# Patient Record
Sex: Male | Born: 1960 | Race: White | Hispanic: No | Marital: Married | Smoking: Never smoker
Health system: Southern US, Community
[De-identification: ages and names within clinical notes are randomized; demographics above are authoritative.]

## PROBLEM LIST (undated history)

## (undated) DIAGNOSIS — C8190 Hodgkin lymphoma, unspecified, unspecified site: Secondary | ICD-10-CM

## (undated) DIAGNOSIS — I509 Heart failure, unspecified: Secondary | ICD-10-CM

## (undated) DIAGNOSIS — E119 Type 2 diabetes mellitus without complications: Secondary | ICD-10-CM

## (undated) DIAGNOSIS — E785 Hyperlipidemia, unspecified: Secondary | ICD-10-CM

## (undated) DIAGNOSIS — E079 Disorder of thyroid, unspecified: Secondary | ICD-10-CM

## (undated) DIAGNOSIS — C801 Malignant (primary) neoplasm, unspecified: Secondary | ICD-10-CM

## (undated) HISTORY — PX: CHOLECYSTECTOMY: SHX55

## (undated) HISTORY — PX: HERNIA REPAIR: SHX51

## (undated) HISTORY — PX: FOOT SURGERY: SHX648

## (undated) HISTORY — PX: BONE MARROW TRANSPLANT: SHX200

## (undated) HISTORY — PX: EXPLORATORY LAPAROTOMY: SUR591

## (undated) HISTORY — PX: SPLENECTOMY: SUR1306

---

## 2010-12-21 ENCOUNTER — Ambulatory Visit: Payer: Self-pay | Admitting: General Practice

## 2012-12-11 ENCOUNTER — Ambulatory Visit: Payer: Self-pay | Admitting: General Practice

## 2013-02-17 LAB — CBC
HGB: 14.6 g/dL (ref 13.0–18.0)
RBC: 4.56 10*6/uL (ref 4.40–5.90)

## 2013-02-17 LAB — BASIC METABOLIC PANEL
BUN: 27 mg/dL — ABNORMAL HIGH (ref 7–18)
Chloride: 102 mmol/L (ref 98–107)
Creatinine: 1.39 mg/dL — ABNORMAL HIGH (ref 0.60–1.30)
Glucose: 127 mg/dL — ABNORMAL HIGH (ref 65–99)
Osmolality: 284 (ref 275–301)
Potassium: 4.3 mmol/L (ref 3.5–5.1)

## 2013-02-17 LAB — TROPONIN I: Troponin-I: 0.02 ng/mL

## 2013-02-18 ENCOUNTER — Inpatient Hospital Stay: Payer: Self-pay | Admitting: Student

## 2013-02-18 LAB — CK
CK, Total: 152 U/L (ref 35–232)
CK, Total: 124 U/L (ref 35–232)

## 2013-02-18 LAB — TSH
Thyroid Stimulating Horm: 0.991 u[IU]/mL
Thyroid Stimulating Horm: 1.04 u[IU]/mL

## 2013-02-18 LAB — TROPONIN I: Troponin-I: 0.03 ng/mL

## 2013-02-19 LAB — PRO B NATRIURETIC PEPTIDE: B-Type Natriuretic Peptide: 1048 pg/mL — ABNORMAL HIGH (ref 0–125)

## 2013-02-19 LAB — CBC WITH DIFFERENTIAL/PLATELET
Basophil #: 0.1 10*3/uL (ref 0.0–0.1)
Basophil %: 0.7 %
Eosinophil #: 0.3 10*3/uL (ref 0.0–0.7)
Eosinophil %: 2.5 %
HCT: 39.6 % — ABNORMAL LOW (ref 40.0–52.0)
HGB: 13.9 g/dL (ref 13.0–18.0)
MCHC: 35 g/dL (ref 32.0–36.0)
MCV: 93 fL (ref 80–100)
Monocyte %: 12.2 %
Neutrophil %: 48.4 %
RBC: 4.24 10*6/uL — ABNORMAL LOW (ref 4.40–5.90)
RDW: 15.1 % — ABNORMAL HIGH (ref 11.5–14.5)
WBC: 10.1 10*3/uL (ref 3.8–10.6)

## 2013-02-19 LAB — LIPID PANEL
Cholesterol: 106 mg/dL (ref 0–200)
HDL Cholesterol: 35 mg/dL — ABNORMAL LOW (ref 40–60)
Ldl Cholesterol, Calc: 41 mg/dL (ref 0–100)
VLDL Cholesterol, Calc: 30 mg/dL (ref 5–40)

## 2013-02-19 LAB — BASIC METABOLIC PANEL
Creatinine: 1.72 mg/dL — ABNORMAL HIGH (ref 0.60–1.30)
EGFR (African American): 51 — ABNORMAL LOW
EGFR (Non-African Amer.): 44 — ABNORMAL LOW
Osmolality: 286 (ref 275–301)
Potassium: 4 mmol/L (ref 3.5–5.1)

## 2013-02-20 LAB — BASIC METABOLIC PANEL
Anion Gap: 8 (ref 7–16)
Co2: 27 mmol/L (ref 21–32)
Sodium: 138 mmol/L (ref 136–145)

## 2013-02-21 LAB — BASIC METABOLIC PANEL
Anion Gap: 8 (ref 7–16)
BUN: 36 mg/dL — ABNORMAL HIGH (ref 7–18)
Creatinine: 1.78 mg/dL — ABNORMAL HIGH (ref 0.60–1.30)
EGFR (Non-African Amer.): 42 — ABNORMAL LOW
Glucose: 137 mg/dL — ABNORMAL HIGH (ref 65–99)
Osmolality: 288 (ref 275–301)

## 2014-10-03 DIAGNOSIS — I5022 Chronic systolic (congestive) heart failure: Secondary | ICD-10-CM | POA: Insufficient documentation

## 2014-12-30 DIAGNOSIS — N183 Chronic kidney disease, stage 3 unspecified: Secondary | ICD-10-CM | POA: Insufficient documentation

## 2014-12-30 DIAGNOSIS — E1129 Type 2 diabetes mellitus with other diabetic kidney complication: Secondary | ICD-10-CM | POA: Insufficient documentation

## 2014-12-30 DIAGNOSIS — Z9641 Presence of insulin pump (external) (internal): Secondary | ICD-10-CM | POA: Insufficient documentation

## 2014-12-30 DIAGNOSIS — Z9889 Other specified postprocedural states: Secondary | ICD-10-CM | POA: Insufficient documentation

## 2014-12-30 DIAGNOSIS — IMO0002 Reserved for concepts with insufficient information to code with codable children: Secondary | ICD-10-CM | POA: Insufficient documentation

## 2014-12-30 DIAGNOSIS — E1165 Type 2 diabetes mellitus with hyperglycemia: Secondary | ICD-10-CM

## 2014-12-30 DIAGNOSIS — E039 Hypothyroidism, unspecified: Secondary | ICD-10-CM | POA: Insufficient documentation

## 2015-03-14 NOTE — Discharge Summary (Signed)
PATIENT NAME:  Stephen Harmon, Stephen Harmon MR#:  [id number] DATE OF BIRTH:  April 26, 1961  DATE OF ADMISSION:  02/18/2013 DATE OF DISCHARGE:  02/21/2013  CONSULTANT: Dr. Nehemiah Massed from cardiology   PRIMARY CARE PHYSICIAN: Dr. Ginette Pitman at [facility] which he no longer is following with and he is the process of getting another PCP.    CHIEF COMPLAINT: Shortness of breath.   DISCHARGE DIAGNOSES: 1.  Acute respiratory failure, now resolved.  2.  Acute on chronic systolic congestive heart failure with severe cardiomyopathy with valvular abnormalities with ejection fraction of less than 20%, possibly chemotherapy-related.  3.  Diabetes.  4.  Renal failure, possible acute on chronic.  5.  History of Hodgkin's lymphoma, previously on doxorubicin, status post stem cell transplant.  6.  Hypothyroidism.  7.  Hyponatremia.    DISCHARGE MEDICATIONS:  Glimepiride 2 mg 1 tab 2 times a day, atorvastatin 10 mg daily, levothyroxine 25 mcg daily, allopurinol 100 mg daily, lisinopril 2.5 mg daily, aspirin 81 mg daily, carvedilol 3.125 mg 2 times a day.   DIET: Low sodium, diabetic diet.   ACTIVITY: As tolerated.   FOLLOWUP: Please follow with Dr. Nehemiah Massed on Monday, as scheduled for you.  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 Please follow with your PCP within 1 to 2 weeks.   DISPOSITION: Home. Check kidney function within a week. The patient is a full code.   SIGNIFICANT LABS AND IMAGING: Initial BNP was 1393. Albumin 27, creatinine 1.39, sodium 128. Troponins were negative x 3. Initial TSH was 0.991. Initial WBC 14.2 and then was 10.1 on March 31st. Peak creatinine was 1.78, last creatinine is 1.78, last BUN is 36. HDL is 35. Hemoglobin A1c is 6.7. LDL is 41. Echocardiogram done on March 30th shows EF of less than 20%, severely decreased global LV systolic function, moderate increased left ventricular internal cavity size, severe dilated left and right  atrium, severe mitral regurg, moderate to severe tricuspid regurg and dilated cardiomyopathy in general. Chest, PA and lateral x-ray showing shallow inspiration, interstitial infiltrate, mild edema.   HISTORY OF PRESENT ILLNESS AND HOSPITAL COURSE: For details of H and P, please see the dictation on March 30th by Dr. Margaretmary Eddy but, briefly, this is a pleasant 54 year old gentleman with history of Hodgkin's lymphoma who was on doxorubicin and had a stem cell transplant, diabetes, hypothyroidism, who presented with subacute to chronic progressive fatigue, dyspnea on exertion and shortness of breath. He has had several bouts of antibiotics and as his symptoms persisted he presented to the hospital. He also complained of some orthopnea and was noted to have elevated BNP slightly and an x-ray suggestive of CHF. He was admitted to the hospitalist service and cardiology was consulted. The patient also came in with sats less than 88% and required oxygen supplementation initially but that resolved. He did receive 2 doses of IV Lasix and post that his acute respiratory failure resolved; however, he underwent an echocardiogram showing significant cardiomyopathy. It is likely that this is doxorubicin-related but, at this point, the etiology remains unclear. He was seen by cardiology and, as he has received a couple of doses of Lasix and does not appear to be significantly volume overloaded, we have held the Lasix given that the creatinine has risen. It has remained stable in the 1.7 range. His blood pressure is on the lower side, possibly poor forward flow issue from his severe cardiomyopathy and he is on low-dose Coreg  and low-dose lisinopril has been started. Given the low blood pressure, we have no room to up the beta blocker nor the ACE inhibitor given the renal failure at this point. We recommended holding the testosterone as it can cause CHF exacerbations and ill also hold the metformin upon discharge but at this point he  is ambulatory and without significant symptoms. Would recommend outpatient followup with cardiology sooner than later and a stress test is being contemplated. He would also probably need a cardiac catheterization once his GFR is better to see if there is an element of ischemic cardiomyopathy. Furthermore, an echocardiogram should be repeated and if it still shows low EF, he might benefit from AICD/pacemaker placement but that will be determined on a later date post repeat echocardiogram. He did have mild hyponatremia which was possibly from CHF and that has resolved. At this point, his symptoms are better. He will be discharged with outpatient followup  and he is in the process of getting a new PCP.   TOTAL TIME SPENT: 40 minutes.   CODE STATUS: The patient is full code.    ____________________________ Vivien Presto, MD sa:cs D: 02/21/2013 13:57:00 ET T: 02/21/2013 14:12:50 ET JOB#: [id number]  cc: Vivien Presto, MD, <Dictator> Vivien Presto MD ELECTRONICALLY SIGNED 02/22/2013 22:13

## 2015-03-14 NOTE — Consult Note (Signed)
PATIENT NAME:  Stephen Harmon, Stephen Harmon MR#:  [id number] DATE OF BIRTH:  08/15/1961  DATE OF CONSULTATION:  02/18/2013  REFERRING PHYSICIAN:  Albertine Patricia, MD CONSULTING PHYSICIAN:  Corey Skains, MD  REASON FOR CONSULTATION:  Acute congestive heart failure.   CHIEF COMPLAINT:  "I have been short of breath, weak and fatigued with swelling."   HISTORY OF PRESENT ILLNESS:  This is a 54 year old male with a history of Adriamycin for cancer as well as radiation who has had new onset of significant lower extremity edema, pulmonary edema and shortness of breath with physical activity relieved by rest. He does have mild chronic kidney disease, hypertension, diabetes of which has been very well controlled. The patient has had an EKG showing sinus tachycardia, but otherwise normal. He has no current evidence of elevation of troponin or myocardial infarction. The patient has had some relief with treatment with Lasix and has had some improvement.   REVIEW OF SYSTEMS:  The remainder of his review of systems negative for vision change, ringing in the ears, hearing loss, cough, congestion, heartburn, nausea, vomiting, diarrhea, bloody stools, stomach pain, extremity pain, leg weakness, cramping of the buttocks, known blood clots, headaches, blackouts, dizzy spells, nosebleeds, congestion, trouble swallowing, frequent urination, urination at night, muscle weakness, numbness, anxiety, depression, skin lesions and skin rashes.   PAST MEDICAL HISTORY:   1.  Hypertension.  2.  Hyperlipidemia.  3.  Diabetes.   FAMILY HISTORY:  No family members with early onset of cardiovascular disease or hypertension.   SOCIAL HISTORY:  Currently denies alcohol or tobacco use.   ALLERGIES:  As listed.   MEDICATIONS:  As listed.   PHYSICAL EXAMINATION: VITAL SIGNS: Blood pressure 110/68 bilaterally, heart rate 72 upright, reclining and regular.  GENERAL: He is a well-appearing male in no acute distress.  HEENT: No  icterus, thyromegaly, ulcers, hemorrhage, or xanthelasma.  CARDIOVASCULAR: Regular rate and rhythm. Normal S1, S2 with a 2/6 apical murmur consistent with mitral regurgitation. PMI is diffuse. Carotid upstroke normal without bruit. Jugular venous pressure is normal.  LUNGS: Have few basilar crackles with normal respirations.  ABDOMEN: Soft, nontender without hepatosplenomegaly or masses. Abdominal aorta is normal size without bruit.  EXTREMITIES: 2+ radial pulses andradial and femoral arteries with 1+ lower extremity edema. No cyanosis, clubbing or ulcers.  NEUROLOGIC: He is oriented to time, place and person with normal mood and affect.   ASSESSMENT:  A 54 year old male with Adriamycin and radiation in the past with hypertension, diabetes, hyperlipidemia with normal EKG and normal troponin and congestive heart failure, acute systolic in nature.   RECOMMENDATIONS:   1.  Echocardiogram for LV systolic dysfunction, valvular heart disease and further treatment thereof as necessary.  2.  Intravenous Lasix for lower extremity edema, pulmonary edema and for shortness of breath.  3.  Initiate beta-blocker, ACE inhibitor as able for further risk reduction and treatment of cardiomyopathy.  4.  Serial ECG and enzymes to assess for possible myocardial infarction.  5.  Further investigation including stress test for possible myocardial ischemia as a cause as listed above.    ____________________________ Corey Skains, MD bjk:si D: 02/18/2013 37:48:27 ET T: 02/18/2013 20:43:44 ET JOB#: [id number]  cc: Corey Skains, MD, <Dictator> Corey Skains MD ELECTRONICALLY SIGNED 02/26/2013 8:03

## 2015-03-14 NOTE — H&P (Signed)
PATIENT NAME:  Stephen Harmon, Stephen Harmon MR#:  [id number] DATE OF BIRTH:  1961-01-28  DATE OF ADMISSION:  02/18/2013  PRIMARY CARE PHYSICIAN:  Dr. Altamese Kensington.  REFERRING PHYSICIAN:  Dr. Beather Arbour.   CHIEF COMPLAINT:  Shortness of breath.   HISTORY OF PRESENT ILLNESS:  The patient is a 54 year old pleasant Caucasian male with a past medical history of diabetes mellitus type 2, Hodgkin's status post stem cell transplantation, diabetes, hypothyroidism is presenting to the ER with a chief complaint of shortness of breath.  The patient is reporting that he has been dyspneic for the past few weeks which has been slowly getting worse.  The patient works in [employer] office and he was seen by [employer].  Initially he was diagnosed with sinusitis and he was treated with a Z-Pak with no significant improvement.  Followed by the Z-Pak, he was given by mouth levofloxacin which was completed by him approximately two weeks ago.  He did not see any significant improvement.  Progressively his shortness of breath has been getting worse and when he went to see his doctor at clinic they have ordered chest x-ray, but they could not find any abnormalities on the chest x-ray.  Apparently, patient's shortness of breath has been progressively getting worse and he was unable to lie flat for the past two days and he has been just sleeping in a recliner.  Yesterday, he was having chest tightness regarding which he came into the ER.  No similar complaints in the past.  Does not have any heart conditions.  Denies any hypertension or high cholesterol problem and not seen by any cardiologist in the past or never had any stress test in the past.  In the ER, patient had chest x-ray done for shortness of breath which has revealed the pulmonary edema.  The patient was given Lasix 20 mg IV.  Initial sodium was 128 and creatinine was 1.39.  Hospitalist team is called to admit the patient regarding this.  Also, they have mentioned that patient  was hypoxic at 87% to 91% on room air and he is sating 96% to 97% on 2 liters.  Otherwise, no complaints.  The patient was given 1/2 inch nitro paste to the anterior chest wall for the chest discomfort.  Following that, patient started feeling better.  Wife is at bedside.   PAST MEDICAL HISTORY:  Hodgkin's lymphoma status post stem cell transplantation, diabetes mellitus type 2, hypothyroidism.   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   ALLERGIES:  The patient is allergic to AMOXICILLIN.   HOME MEDICATIONS:  Are not reconciled yet.   PSYCHOSOCIAL HISTORY:  Lives at home with wife.  Denies any history of smoking.  Occasional intake of alcohol.  Denies any illicit drug usage.   FAMILY HISTORY:  His father had a heart condition and mom had a history of Alzheimer's disease.  Paternal grandparents have a history of diabetes mellitus.   REVIEW OF SYSTEMS:  CONSTITUTIONAL:  Denies any fever, fatigue, weight loss or weight gain.  EYES:  Denies any blurry vision, glaucoma, cataracts.  EARS, NOSE, THROAT:  No epistaxis, discharge, postnasal drip.  Has history of allergic rhinitis.  RESPIRATIONS:  Denies any cough or wheezing, hemoptysis.  Denies any history of COPD.  Complaining of shortness of breath.  CARDIOVASCULAR:  No chest pain, orthopnea, palpitations, syncope.  GASTROINTESTINAL:  No nausea, vomiting, diarrhea or hematemesis.  GENITOURINARY:  No dysuria, hematuria, renal calculi.   ENDOCRINE:  No polyuria, nocturia.  He has history of hypothyroidism.  He has history of diabetes mellitus.  HEMATOLOGIC AND LYMPHATIC:  Has a chronic history of Hodgkin's lymphoma status post stem cell transplantation.  Denies any bleeding or swollen glands.   INTEGUMENTARY:  No acne, rash, lesions.  MUSCULOSKELETAL:  Denies any neck pain or back pain.  He denies any history of arthritis.  NEUROLOGIC:  No  vertigo, ataxia, dementia, headache.  PSYCHIATRIC:  No ADD, OCD, bipolar disorder.   PHYSICAL EXAMINATION: VITAL SIGNS:  Temperature 98.5, pulse 82, respirations 15, blood pressure 137/73, sating 96% to 97% on 2 liters, but the patient was initially sating 87% to 91% on room air.  GENERAL APPEARANCE:  Not under acute distress, moderately built and moderately nourished.  HEENT:  Normocephalic, atraumatic.  Pupils are equally reacting to light and accommodation.  No scleral icterus.  No conjunctival injection.  No sinus tenderness.  No nasal discharge.  No angioedema.  No postnasal drip.  No exudates in the pharynx, uvula.  No ear discharge. NECK:  Supple.  No JVD.  No thyromegaly.  LUNGS:  Positive rales and rhonchi bilaterally, more in the lower lung fields.  No accessory muscle use.  No anterior chest wall tenderness on palpation.  CARDIAC:  S1, S2 normal.  Regular rate and rhythm.  No murmurs.  GASTROINTESTINAL:  Soft.  Bowel sounds are positive in all four quadrants.  Nontender, nondistended.  No masses felt.  No hepatosplenomegaly.  The patient is in fact status post splenectomy.  NEUROLOGIC:  Awake, oriented x 3.  Answering questions appropriately.  Motor and sensory are grossly intact.  Cranial nerves II through XII are grossly intact.  Reflexes are 2+.  SKIN:  Warm to touch.  Normal turgor.  No rashes or lesions noticed.  MUSCULOSKELETAL:  No joint effusion, tenderness or erythema.  EXTREMITIES:  No edema.  No cyanosis.  No clubbing.  No pedal edema.  Peripheral pulses are 2+.  PSYCH: Normal mood and effect  LABORATORY DATA AND IMAGING STUDIES:  Chest x-ray, diffuse edema is present.  Glucose is 127, BUN 27, creatinine 1.39, sodium is 128, potassium 4.3, chloride 102, CO2 21, serum osmolality 284, calcium 9.0.  Troponin is 0.302.  WBC 14.2, hemoglobin is 14.6, hematocrit 43.0, platelet count 308, BNP is ordered which is pending at this time.   ASSESSMENT AND PLAN:   1.  The shortness of  breath probably from new onset congestive heart failure.  We will admit the patient to telemetry.  We will start him on Lasix IV, beta-blocker, aspirin and statin.  A 2-D echocardiogram is ordered.  Cardiology consult is placed to on-call cardiology, Dr. Nehemiah Massed.  We will cycle cardiac biomarkers.  We will obtain daily weights and monitor all fluid intake and output.  2.  Hyponatremia.  We will monitor renal function closely.  We will check sodium in the a.m.  The patient is with new-onset congestive heart failure, I cannot give him any IV fluids.  The patient is mentating fine.  We will continue close monitoring. 3.  Hypothyroidism.  We will resume home medications soon after home medications are reconciled.  4.  Diabetes mellitus type 2.  The patient will be on sliding scale insulin.  We will resume home medications after med reconciliation.  5.  Allergic rhinitis.  We will start him on zyrtec or something equivalent to that.  6.  Acute kidney injury.  We will avoid nephrotoxins.  The patient being on Lasix, we will continue close monitoring of the renal function and if necessary we will put a consult to nephrology.   7.  We will provide GI and deep vein thrombosis prophylaxis with Protonix and heparin subQ.  The diagnosis and plan of care was discussed in detail with the patient and his wife at bedside.  They verbalized good understanding of the plan of care.  All their questions were answered to their satisfaction.   TOTAL TIME SPENT ON ADMISSION:  50 minutes.    ____________________________ Nicholes Mango, MD ag:ea D: 02/18/2013 01:30:59 ET T: 02/18/2013 02:32:52 ET JOB#: [id number]  cc: Nicholes Mango, MD, <Dictator> Astrid Divine. Marcelino Scot, MD Dr. Samuel Germany MD ELECTRONICALLY SIGNED 02/22/2013 13:28

## 2015-04-02 DIAGNOSIS — I10 Essential (primary) hypertension: Secondary | ICD-10-CM | POA: Insufficient documentation

## 2015-10-01 DIAGNOSIS — R0681 Apnea, not elsewhere classified: Secondary | ICD-10-CM | POA: Insufficient documentation

## 2015-10-15 DIAGNOSIS — R600 Localized edema: Secondary | ICD-10-CM | POA: Insufficient documentation

## 2015-11-13 ENCOUNTER — Other Ambulatory Visit: Payer: Self-pay | Admitting: Emergency Medicine

## 2015-11-13 MED ORDER — ALLOPURINOL 100 MG PO TABS: 100.0000 mg | ORAL_TABLET | Freq: Every day | ORAL | Status: DC

## 2015-11-13 NOTE — Telephone Encounter (Signed)
Received a faxed medication request from [facility].  Please advise.  Thank you. 

## 2015-11-13 NOTE — Telephone Encounter (Signed)
Med refill approved, will need labs or proof of labs with pcp prior to next refill

## 2015-11-27 ENCOUNTER — Other Ambulatory Visit: Payer: Self-pay | Admitting: Emergency Medicine

## 2015-11-27 MED ORDER — LEVOTHYROXINE SODIUM 25 MCG PO TABS: 25.0000 ug | ORAL_TABLET | Freq: Every day | ORAL | Status: AC

## 2015-11-27 NOTE — Telephone Encounter (Signed)
Received a faxed medication request from [facility].  Please advise.  Thank you. 

## 2015-11-27 NOTE — Telephone Encounter (Signed)
Med refill approved 

## 2016-01-30 ENCOUNTER — Encounter: Payer: Self-pay | Admitting: Physician Assistant

## 2016-01-30 ENCOUNTER — Ambulatory Visit: Payer: Self-pay | Admitting: Physician Assistant

## 2016-01-30 VITALS — BP 147/74 | HR 73 | Temp 97.0°F

## 2016-01-30 DIAGNOSIS — J302 Other seasonal allergic rhinitis: Secondary | ICD-10-CM

## 2016-01-30 DIAGNOSIS — E119 Type 2 diabetes mellitus without complications: Secondary | ICD-10-CM | POA: Insufficient documentation

## 2016-01-30 DIAGNOSIS — C8190 Hodgkin lymphoma, unspecified, unspecified site: Secondary | ICD-10-CM | POA: Insufficient documentation

## 2016-01-30 DIAGNOSIS — E782 Mixed hyperlipidemia: Secondary | ICD-10-CM | POA: Insufficient documentation

## 2016-01-30 DIAGNOSIS — J019 Acute sinusitis, unspecified: Secondary | ICD-10-CM

## 2016-01-30 MED ORDER — CEFDINIR 300 MG PO CAPS: 300.0000 mg | ORAL_CAPSULE | Freq: Two times a day (BID) | ORAL | Status: DC

## 2016-01-30 NOTE — Progress Notes (Signed)
S/ nasal congestion , blowing yellow sinus and ear pressure ,denies fever or body aches, seasonal allergies, states he was told he had scar tissue in his sinuses from tx of hodgkin lymphoma Denies CP or change in SOB , edema   O/ VSS NAD ENT : tms scarred , retracted , clear, nasal mucosa swollen , inflamed with purulent rhinorhea, + max tenderness , pharynx mildly injected , neck supple, Heart rsr lungs clear , ext without edema  A/ Seasonal allergies ,sinusitis  P/ rx omnicef  . Supportive measures discussed. Follow up prn not improving    Nasal saline products bid and prn

## 2016-03-18 ENCOUNTER — Encounter: Payer: Self-pay | Admitting: Emergency Medicine

## 2016-03-18 ENCOUNTER — Observation Stay
Admission: EM | Admit: 2016-03-18 | Discharge: 2016-03-19 | Disposition: A | Discharge status: Home / Self Care | Payer: Medicare Other | Attending: Internal Medicine | Admitting: Internal Medicine

## 2016-03-18 ENCOUNTER — Observation Stay: Payer: Medicare Other

## 2016-03-18 ENCOUNTER — Emergency Department: Payer: Medicare Other

## 2016-03-18 DIAGNOSIS — K760 Fatty (change of) liver, not elsewhere classified: Secondary | ICD-10-CM | POA: Insufficient documentation

## 2016-03-18 DIAGNOSIS — Z79899 Other long term (current) drug therapy: Secondary | ICD-10-CM | POA: Diagnosis not present

## 2016-03-18 DIAGNOSIS — E785 Hyperlipidemia, unspecified: Secondary | ICD-10-CM | POA: Diagnosis not present

## 2016-03-18 DIAGNOSIS — Z794 Long term (current) use of insulin: Secondary | ICD-10-CM | POA: Insufficient documentation

## 2016-03-18 DIAGNOSIS — Z9641 Presence of insulin pump (external) (internal): Secondary | ICD-10-CM | POA: Diagnosis not present

## 2016-03-18 DIAGNOSIS — E079 Disorder of thyroid, unspecified: Secondary | ICD-10-CM | POA: Diagnosis not present

## 2016-03-18 DIAGNOSIS — R110 Nausea: Secondary | ICD-10-CM | POA: Insufficient documentation

## 2016-03-18 DIAGNOSIS — Z833 Family history of diabetes mellitus: Secondary | ICD-10-CM | POA: Insufficient documentation

## 2016-03-18 DIAGNOSIS — Z7982 Long term (current) use of aspirin: Secondary | ICD-10-CM | POA: Diagnosis not present

## 2016-03-18 DIAGNOSIS — E1122 Type 2 diabetes mellitus with diabetic chronic kidney disease: Secondary | ICD-10-CM | POA: Diagnosis not present

## 2016-03-18 DIAGNOSIS — R0789 Other chest pain: Secondary | ICD-10-CM | POA: Diagnosis not present

## 2016-03-18 DIAGNOSIS — E119 Type 2 diabetes mellitus without complications: Secondary | ICD-10-CM | POA: Diagnosis not present

## 2016-03-18 DIAGNOSIS — Z880 Allergy status to penicillin: Secondary | ICD-10-CM | POA: Insufficient documentation

## 2016-03-18 DIAGNOSIS — N189 Chronic kidney disease, unspecified: Secondary | ICD-10-CM | POA: Insufficient documentation

## 2016-03-18 DIAGNOSIS — I252 Old myocardial infarction: Secondary | ICD-10-CM | POA: Insufficient documentation

## 2016-03-18 DIAGNOSIS — I5042 Chronic combined systolic (congestive) and diastolic (congestive) heart failure: Secondary | ICD-10-CM | POA: Diagnosis not present

## 2016-03-18 DIAGNOSIS — Z9049 Acquired absence of other specified parts of digestive tract: Secondary | ICD-10-CM | POA: Insufficient documentation

## 2016-03-18 DIAGNOSIS — Z9889 Other specified postprocedural states: Secondary | ICD-10-CM | POA: Diagnosis not present

## 2016-03-18 DIAGNOSIS — R079 Chest pain, unspecified: Secondary | ICD-10-CM | POA: Diagnosis present

## 2016-03-18 DIAGNOSIS — Z8249 Family history of ischemic heart disease and other diseases of the circulatory system: Secondary | ICD-10-CM | POA: Diagnosis not present

## 2016-03-18 DIAGNOSIS — Z8571 Personal history of Hodgkin lymphoma: Secondary | ICD-10-CM | POA: Diagnosis not present

## 2016-03-18 DIAGNOSIS — I429 Cardiomyopathy, unspecified: Secondary | ICD-10-CM | POA: Insufficient documentation

## 2016-03-18 DIAGNOSIS — I208 Other forms of angina pectoris: Secondary | ICD-10-CM

## 2016-03-18 HISTORY — DX: Heart failure, unspecified: I50.9

## 2016-03-18 HISTORY — DX: Hyperlipidemia, unspecified: E78.5

## 2016-03-18 HISTORY — DX: Hodgkin lymphoma, unspecified, unspecified site: C81.90

## 2016-03-18 HISTORY — DX: Type 2 diabetes mellitus without complications: E11.9

## 2016-03-18 HISTORY — DX: Malignant (primary) neoplasm, unspecified: C80.1

## 2016-03-18 HISTORY — DX: Disorder of thyroid, unspecified: E07.9

## 2016-03-18 LAB — BASIC METABOLIC PANEL
Anion gap: 12 (ref 5–15)
BUN: 32 mg/dL — ABNORMAL HIGH (ref 6–20)
CALCIUM: 10.1 mg/dL (ref 8.9–10.3)
CHLORIDE: 105 mmol/L (ref 101–111)
CO2: 18 mmol/L — ABNORMAL LOW (ref 22–32)
CREATININE: 1.64 mg/dL — AB (ref 0.61–1.24)
GFR, EST AFRICAN AMERICAN: 52 mL/min — AB (ref >60)
GFR, EST NON AFRICAN AMERICAN: 45 mL/min — AB (ref >60)
Glucose, Bld: 237 mg/dL — ABNORMAL HIGH (ref 65–99)
Potassium: 4.5 mmol/L (ref 3.5–5.1)
SODIUM: 135 mmol/L (ref 135–145)

## 2016-03-18 LAB — GLUCOSE, CAPILLARY
GLUCOSE-CAPILLARY: 245 mg/dL — AB (ref 65–99)
GLUCOSE-CAPILLARY: 254 mg/dL — AB (ref 65–99)
Glucose-Capillary: 304 mg/dL — ABNORMAL HIGH (ref 65–99)
Glucose-Capillary: 213 mg/dL — ABNORMAL HIGH (ref 65–99)

## 2016-03-18 LAB — CBC
HCT: 39.2 % — ABNORMAL LOW (ref 40.0–52.0)
Hemoglobin: 13.5 g/dL (ref 13.0–18.0)
MCH: 32.9 pg (ref 26.0–34.0)
MCHC: 34.3 g/dL (ref 32.0–36.0)
MCV: 95.8 fL (ref 80.0–100.0)
PLATELETS: 315 10*3/uL (ref 150–440)
RBC: 4.1 MIL/uL — AB (ref 4.40–5.90)
RDW: 13.9 % (ref 11.5–14.5)
WBC: 13.4 10*3/uL — AB (ref 3.8–10.6)

## 2016-03-18 LAB — TROPONIN I
Troponin I: <0.03 ng/mL (ref <0.031)
Troponin I: 0.04 ng/mL — ABNORMAL HIGH (ref <0.031)

## 2016-03-18 MED ORDER — MORPHINE SULFATE (PF) 2 MG/ML IV SOLN
2.0000 mg | INTRAVENOUS | Status: DC | PRN
  Administered 2016-03-18 (×3): 2 mg via INTRAVENOUS
  Filled 2016-03-18 (×3): qty 1

## 2016-03-18 MED ORDER — FUROSEMIDE 20 MG PO TABS
20.0000 mg | ORAL_TABLET | Freq: Every day | ORAL | Status: DC
  Filled 2016-03-18: qty 1

## 2016-03-18 MED ORDER — OXYCODONE-ACETAMINOPHEN 5-325 MG PO TABS
1.0000 | ORAL_TABLET | Freq: Four times a day (QID) | ORAL | Status: DC | PRN
  Administered 2016-03-18 (×2): 1 via ORAL
  Filled 2016-03-18 (×2): qty 1

## 2016-03-18 MED ORDER — SODIUM CHLORIDE 0.9 % IV SOLN: 250.0000 mL | INTRAVENOUS | Status: DC | PRN

## 2016-03-18 MED ORDER — ASPIRIN 81 MG PO CHEW: 324.0000 mg | CHEWABLE_TABLET | ORAL | Status: AC

## 2016-03-18 MED ORDER — SODIUM CHLORIDE 0.9% FLUSH: 3.0000 mL | INTRAVENOUS | Status: DC | PRN

## 2016-03-18 MED ORDER — LEVOTHYROXINE SODIUM 25 MCG PO TABS
25.0000 ug | ORAL_TABLET | Freq: Every day | ORAL | Status: DC
  Administered 2016-03-18 – 2016-03-19 (×2): 25 ug via ORAL
  Filled 2016-03-18 (×3): qty 1

## 2016-03-18 MED ORDER — INSULIN PUMP
Freq: Three times a day (TID) | SUBCUTANEOUS | Status: DC
  Administered 2016-03-18: 9.6 via SUBCUTANEOUS
  Administered 2016-03-18: 3.6 via SUBCUTANEOUS
  Administered 2016-03-18: 2.2 via SUBCUTANEOUS
  Administered 2016-03-18: 22:00:00 via SUBCUTANEOUS
  Administered 2016-03-19: 9.5 via SUBCUTANEOUS
  Administered 2016-03-19: 02:00:00 via SUBCUTANEOUS
  Filled 2016-03-18: qty 1

## 2016-03-18 MED ORDER — NITROGLYCERIN 0.4 MG SL SUBL
0.4000 mg | SUBLINGUAL_TABLET | Freq: Once | SUBLINGUAL | Status: AC
  Administered 2016-03-18: 0.4 mg via SUBLINGUAL
  Filled 2016-03-18: qty 1

## 2016-03-18 MED ORDER — ASPIRIN 81 MG PO CHEW: 81.0000 mg | CHEWABLE_TABLET | Freq: Every day | ORAL | Status: DC

## 2016-03-18 MED ORDER — ASPIRIN EC 81 MG PO TBEC
81.0000 mg | DELAYED_RELEASE_TABLET | Freq: Every day | ORAL | Status: DC
Start: 2016-03-19 — End: 2016-03-19
  Administered 2016-03-19: 81 mg via ORAL
  Filled 2016-03-18: qty 1

## 2016-03-18 MED ORDER — INSULIN PUMP
1.0000 | Freq: Every day | SUBCUTANEOUS | Status: DC
  Filled 2016-03-18: qty 1

## 2016-03-18 MED ORDER — GLUCOSE BLOOD VI STRP: 1.0000 | ORAL_STRIP | Status: DC

## 2016-03-18 MED ORDER — HYDROMORPHONE HCL 1 MG/ML IJ SOLN: 1.0000 mg | Freq: Once | INTRAMUSCULAR | Status: DC

## 2016-03-18 MED ORDER — NITROGLYCERIN 0.4 MG SL SUBL: 0.4000 mg | SUBLINGUAL_TABLET | SUBLINGUAL | Status: DC | PRN

## 2016-03-18 MED ORDER — ALLOPURINOL 100 MG PO TABS
100.0000 mg | ORAL_TABLET | Freq: Every day | ORAL | Status: DC
  Administered 2016-03-18 – 2016-03-19 (×2): 100 mg via ORAL
  Filled 2016-03-18 (×2): qty 1

## 2016-03-18 MED ORDER — HEPARIN SODIUM (PORCINE) 5000 UNIT/ML IJ SOLN: 5000.0000 [IU] | Freq: Three times a day (TID) | INTRAMUSCULAR | Status: DC

## 2016-03-18 MED ORDER — SODIUM CHLORIDE 0.9% FLUSH
3.0000 mL | Freq: Two times a day (BID) | INTRAVENOUS | Status: DC
  Administered 2016-03-18 – 2016-03-19 (×3): 3 mL via INTRAVENOUS

## 2016-03-18 MED ORDER — CARVEDILOL 6.25 MG PO TABS
6.2500 mg | ORAL_TABLET | Freq: Two times a day (BID) | ORAL | Status: DC
  Administered 2016-03-18 (×2): 6.25 mg via ORAL
  Filled 2016-03-18 (×2): qty 1

## 2016-03-18 MED ORDER — INSULIN PUMP
Freq: Three times a day (TID) | SUBCUTANEOUS | Status: DC
  Filled 2016-03-18: qty 1

## 2016-03-18 MED ORDER — SPIRONOLACTONE 25 MG PO TABS
25.0000 mg | ORAL_TABLET | Freq: Every day | ORAL | Status: DC
  Administered 2016-03-18 – 2016-03-19 (×2): 25 mg via ORAL
  Filled 2016-03-18 (×2): qty 1

## 2016-03-18 MED ORDER — ACETAMINOPHEN 325 MG PO TABS: 650.0000 mg | ORAL_TABLET | ORAL | Status: DC | PRN

## 2016-03-18 MED ORDER — MORPHINE SULFATE (PF) 2 MG/ML IV SOLN
2.0000 mg | Freq: Once | INTRAVENOUS | Status: AC
  Administered 2016-03-18: 2 mg via INTRAVENOUS
  Filled 2016-03-18: qty 1

## 2016-03-18 MED ORDER — ATORVASTATIN CALCIUM 10 MG PO TABS
10.0000 mg | ORAL_TABLET | Freq: Every day | ORAL | Status: DC
  Administered 2016-03-18 – 2016-03-19 (×2): 10 mg via ORAL
  Filled 2016-03-18 (×2): qty 1

## 2016-03-18 MED ORDER — ONDANSETRON HCL 4 MG/2ML IJ SOLN: 4.0000 mg | Freq: Four times a day (QID) | INTRAMUSCULAR | Status: DC | PRN

## 2016-03-18 MED ORDER — PROMETHAZINE HCL 25 MG RE SUPP: 25.0000 mg | Freq: Four times a day (QID) | RECTAL | Status: DC | PRN

## 2016-03-18 MED ORDER — ASPIRIN 81 MG PO CHEW
324.0000 mg | CHEWABLE_TABLET | Freq: Once | ORAL | Status: AC
  Administered 2016-03-18: 324 mg via ORAL
  Filled 2016-03-18: qty 4

## 2016-03-18 MED ORDER — ONDANSETRON HCL 4 MG/2ML IJ SOLN
4.0000 mg | Freq: Once | INTRAMUSCULAR | Status: AC
  Administered 2016-03-18: 4 mg via INTRAVENOUS
  Filled 2016-03-18: qty 2

## 2016-03-18 MED ORDER — ASPIRIN 300 MG RE SUPP
300.0000 mg | RECTAL | Status: AC
  Filled 2016-03-18: qty 1

## 2016-03-18 MED ORDER — LISINOPRIL 5 MG PO TABS
2.5000 mg | ORAL_TABLET | Freq: Every day | ORAL | Status: DC
  Administered 2016-03-18 – 2016-03-19 (×2): 2.5 mg via ORAL
  Filled 2016-03-18 (×2): qty 1

## 2016-03-18 MED ORDER — ENOXAPARIN SODIUM 40 MG/0.4ML ~~LOC~~ SOLN
40.0000 mg | SUBCUTANEOUS | Status: DC
  Administered 2016-03-18: 40 mg via SUBCUTANEOUS
  Filled 2016-03-18: qty 0.4

## 2016-03-18 NOTE — ED Notes (Signed)
Pt's BP dropped after administering nitro and Pt became nauseous, Pt's legs were raced and head lowered. Dr. Owens Shark notified of the effects of nitro.

## 2016-03-18 NOTE — Progress Notes (Signed)
[name] at Stephen Harmon: Stephen Harmon    MR#:  [id number]  DATE OF BIRTH:  30-Apr-1961  SUBJECTIVE:  CHIEF COMPLAINT:   Chief Complaint  Patient presents with  . Chest Pain   - Patient with DM and CHF admitted with chest pain, no prior CAD or stents - still has 5/10 left sided chest discomfort - BP dropped with SL nitro yesterday  REVIEW OF SYSTEMS:  Review of Systems  Constitutional: Negative for fever, chills and malaise/fatigue.  HENT: Negative for ear discharge, ear pain and nosebleeds.   Eyes: Negative for blurred vision and double vision.  Respiratory: Negative for cough, shortness of breath and wheezing.   Cardiovascular: Positive for chest pain. Negative for palpitations and leg swelling.  Gastrointestinal: Positive for nausea. Negative for vomiting, abdominal pain, diarrhea and constipation.  Genitourinary: Negative for dysuria and urgency.  Musculoskeletal: Negative for myalgias.  Neurological: Negative for dizziness, sensory change, speech change, focal weakness, seizures and headaches.  Psychiatric/Behavioral: Negative for depression.    DRUG ALLERGIES:   Allergies  Allergen Reactions  . Amoxicillin Hives    VITALS:  Blood pressure 122/52, pulse 68, temperature 97.8 F (36.6 C), temperature source Oral, resp. rate 16, height 5\' 8"  (1.727 m), weight 105.597 kg (232 lb 12.8 oz), SpO2 96 %.  PHYSICAL EXAMINATION:  Physical Exam  GENERAL:  55 y.o.-year-old patient lying in the bed with no acute distress.  EYES: Pupils equal, round, reactive to light and accommodation. No scleral icterus. Extraocular muscles intact.  HEENT: Head atraumatic, normocephalic. Oropharynx and nasopharynx clear.  NECK:  Supple, no jugular venous distention. No thyroid enlargement, no tenderness.  LUNGS: Normal breath sounds bilaterally, no wheezing, rales,rhonchi or crepitation. No use of accessory muscles of respiration.   CARDIOVASCULAR: S1, S2 normal. No murmurs, rubs, or gallops.  ABDOMEN: Soft, nontender, nondistended. Bowel sounds present. No organomegaly or mass.  EXTREMITIES: No pedal edema, cyanosis, or clubbing.  NEUROLOGIC: Cranial nerves II through XII are intact. Muscle strength 5/5 in all extremities. Sensation intact. Gait not checked.  PSYCHIATRIC: The patient is alert and oriented x 3.  SKIN: No obvious rash, lesion, or ulcer.    LABORATORY PANEL:   CBC  Recent Labs Lab 03/18/16 0332  WBC 13.4*  HGB 13.5  HCT 39.2*  PLT 315   ------------------------------------------------------------------------------------------------------------------  Chemistries   Recent Labs Lab 03/18/16 0332  NA 135  K 4.5  CL 105  CO2 18*  GLUCOSE 237*  BUN 32*  CREATININE 1.64*  CALCIUM 10.1   ------------------------------------------------------------------------------------------------------------------  Cardiac Enzymes  Recent Labs Lab 03/18/16 0744  TROPONINI <0.03   ------------------------------------------------------------------------------------------------------------------  RADIOLOGY:  Dg Chest Port 1 View  03/18/2016  CLINICAL DATA:  Acute onset of left-sided chest pain. Initial encounter. EXAM: PORTABLE CHEST 1 VIEW COMPARISON:  Chest radiograph from 02/17/2013 FINDINGS: The lungs are well-aerated and clear. There is no evidence of focal opacification, pleural effusion or pneumothorax. The cardiomediastinal silhouette is borderline normal in size. No acute osseous abnormalities are seen. Clips are noted overlying the left upper quadrant. IMPRESSION: No acute cardiopulmonary process seen. Electronically Signed   By: Garald Balding M.D.   On: 03/18/2016 03:52    EKG:   Orders placed or performed during the hospital encounter of 03/18/16  . EKG 12-Lead  . EKG 12-Lead  . ED EKG  . ED EKG    ASSESSMENT AND PLAN:   55 year old male with past medical history significant  for Hodgkin's lymphoma, non-ischemic  cardiomyopathy with combined heart failure since to the hospital secondary to left-sided chest pain.  #1 left-sided chest pain-atypical features. Could be musculoskeletal. -Continue to recycle cardiac enzymes. If rules out for MI, might need stress test. Scheduled for tomorrow. -Appreciate cardiology consult. -Echocardiogram is pending - Continue cardiac medications. - CT chest today  #2 chronic combined heart failure-last echo as an outpatient was from November 2016. EF is 40% at the time. -Continue as needed Lasix. On Coreg, aspirin, statin and lisinopril.  #3 diabetes mellitus-patient has insulin pump. Appreciate diabetes coordinator's input. Continue current recommendations.  #4 Hodgkin's lymphoma- in remission  #5 DVT Prophylaxis- lovenox  #6 CKD- baseline cr about 1.6, cr is at baseline. Likely diabetic nephropathy    All the records are reviewed and case discussed with Care Management/Social Workerr. Management plans discussed with the patient, family and they are in agreement.  CODE STATUS: Full Code  TOTAL TIME TAKING CARE OF THIS PATIENT: 37 minutes.   POSSIBLE D/C IN 1-2 DAYS, DEPENDING ON CLINICAL CONDITION.   Gladstone Lighter M.D on 03/18/2016 at 1:05 PM  Between 7am to 6pm - Pager - [phone]  After 6pm go to [email or web address] - password [email or web address] [email or web address]  Office  [phone]  CC: Primary care physician; Ashok Cordia, PA-C

## 2016-03-18 NOTE — Care Management Obs Status (Signed)
[facility] NOTIFICATION   Patient Details  Name: KERNIE GERRING MRN: [id number] Date of Birth: 06-03-1961   Medicare Observation Status Notification Given:  Yes    Jolly Mango, RN 03/18/2016, 9:34 AM

## 2016-03-18 NOTE — H&P (Signed)
[facility] NAME: Stephen Harmon    MR#:  [id number]  DATE OF BIRTH:  May 09, 1961  DATE OF ADMISSION:  03/18/2016  PRIMARY CARE PHYSICIAN: Ashok Cordia, PA-C   REQUESTING/REFERRING PHYSICIAN:   CHIEF COMPLAINT:   Chief Complaint  Patient presents with  . Chest Pain    HISTORY OF PRESENT ILLNESS: Stephen Harmon  is a 55 y.o. male with a known history of Congestive heart failure, diabetes mellitus, Hodgkin's lymphoma, thyroid disease, hyperlipidemia presented to the emergency room with chest pain. Patient woke up with chest pain around 1:30 AM this morning. There is located in the left side of chest and radiated to the back. Pain was aching in nature 9 out of 10 on a scale of 1-10. No history of any shortness of breath. No complaints of any orthopnea. No history of fever or chills or cough. Patient was evaluated with an EKG and first set of troponin was negative. Hospitalist service was consulted for further care. Patient follows up with [facility] clinic cardiology.Also complained of nausea but no vomiting.  PAST MEDICAL HISTORY:   Past Medical History  Diagnosis Date  . CHF (congestive heart failure) ([place])   . Diabetes mellitus without complication ([place])   . Cancer ([place])     Hodgkin's Lymphoma in the past  . Thyroid disease   . Hyperlipidemia   . Hodgkin's lymphoma ([place])     PAST SURGICAL HISTORY: Past Surgical History  Procedure Laterality Date  . Splenectomy    . Cholecystectomy    . Exploratory laparotomy    . Foot surgery Bilateral     SOCIAL HISTORY:  Social History  Substance Use Topics  . Smoking status: Never Smoker   . Smokeless tobacco: Not on file  . Alcohol Use: 0.0 oz/week    0 Standard drinks or equivalent per week     Comment: wine occasionally    FAMILY HISTORY:  Family History  Problem Relation Age of Onset  . Heart disease Father   . Diabetes Father     DRUG ALLERGIES:  Allergies   Allergen Reactions  . Amoxicillin Hives    REVIEW OF SYSTEMS:   CONSTITUTIONAL: No fever, fatigue or weakness.  EYES: No blurred or double vision.  EARS, NOSE, AND THROAT: No tinnitus or ear pain.  RESPIRATORY: No cough, shortness of breath, wheezing or hemoptysis.  CARDIOVASCULAR: Has chest pain, no orthopnea, edema.  GASTROINTESTINAL: Has nausea,no vomiting, diarrhea or abdominal pain.  GENITOURINARY: No dysuria, hematuria.  ENDOCRINE: No polyuria, nocturia,  HEMATOLOGY: No anemia, easy bruising or bleeding SKIN: No rash or lesion. MUSCULOSKELETAL: No joint pain or arthritis.   NEUROLOGIC: No tingling, numbness, weakness.  PSYCHIATRY: No anxiety or depression.   MEDICATIONS AT HOME:  Prior to Admission medications   Medication Sig Start Date End Date Taking? Authorizing Provider  allopurinol (ZYLOPRIM) 100 MG tablet Take 1 tablet (100 mg total) by mouth daily. 11/13/15  Yes Versie Starks, PA-C  aspirin 81 MG tablet Take 81 mg by mouth daily.   Yes Historical Provider, MD  atorvastatin (LIPITOR) 10 MG tablet Take 10 mg by mouth daily.   Yes Historical Provider, MD  carvedilol (COREG) 6.25 MG tablet Take 6.25 mg by mouth 2 (two) times daily with a meal.   Yes Historical Provider, MD  furosemide (LASIX) 20 MG tablet Take 20 mg by mouth daily.   Yes Historical Provider, MD  glucose blood test strip 1 each by Other route  every 4 (four) hours. Use as instructed   Yes Historical Provider, MD  insulin regular (NOVOLIN R,HUMULIN R) 100 units/mL injection Inject 1-40 Units into the skin daily. Use up to 40 units per day in insulin pump as directed   Yes Historical Provider, MD  levothyroxine (SYNTHROID, LEVOTHROID) 25 MCG tablet Take 1 tablet (25 mcg total) by mouth daily before breakfast. 11/27/15  Yes Versie Starks, PA-C  lisinopril (PRINIVIL,ZESTRIL) 2.5 MG tablet Take 2.5 mg by mouth daily.   Yes Historical Provider, MD  metFORMIN (GLUCOPHAGE-XR) 500 MG 24 hr tablet Take 500-1,000 mg by  mouth daily with supper. Take one tablet (500 mg) at supper and after one week, increase to 2 tablets (1,000 mg) daily at supper.   Yes Historical Provider, MD  spironolactone (ALDACTONE) 25 MG tablet Take 25 mg by mouth daily.   Yes Historical Provider, MD      PHYSICAL EXAMINATION:   VITAL SIGNS: Blood pressure 126/63, pulse 65, temperature 97.8 F (36.6 C), temperature source Oral, resp. rate 14, height 5\' 8"  (1.727 m), weight 106.595 kg (235 lb), SpO2 95 %.  GENERAL:  55 y.o.-year-old patient lying in the bed with no acute distress.  EYES: Pupils equal, round, reactive to light and accommodation. No scleral icterus. Extraocular muscles intact.  HEENT: Head atraumatic, normocephalic. Oropharynx and nasopharynx clear.  NECK:  Supple, no jugular venous distention. No thyroid enlargement, no tenderness.  LUNGS: Normal breath sounds bilaterally, no wheezing, rales,rhonchi or crepitation. No use of accessory muscles of respiration.  CARDIOVASCULAR: S1, S2 normal. No murmurs, rubs, or gallops.  ABDOMEN: Soft, nontender, nondistended. Bowel sounds present. No organomegaly or mass.  EXTREMITIES: No pedal edema, cyanosis, or clubbing.  NEUROLOGIC: Cranial nerves II through XII are intact. Muscle strength 5/5 in all extremities. Sensation intact. Gait normal. PSYCHIATRIC: The patient is alert and oriented x 3.  SKIN: No obvious rash, lesion, or ulcer.   LABORATORY PANEL:   CBC  Recent Labs Lab 03/18/16 0332  WBC 13.4*  HGB 13.5  HCT 39.2*  PLT 315  MCV 95.8  MCH 32.9  MCHC 34.3  RDW 13.9   ------------------------------------------------------------------------------------------------------------------  Chemistries   Recent Labs Lab 03/18/16 0332  NA 135  K 4.5  CL 105  CO2 18*  GLUCOSE 237*  BUN 32*  CREATININE 1.64*  CALCIUM 10.1   ------------------------------------------------------------------------------------------------------------------ estimated creatinine  clearance is 58.8 mL/min (by C-G formula based on Cr of 1.64). ------------------------------------------------------------------------------------------------------------------ No results for input(s): TSH, T4TOTAL, T3FREE, THYROIDAB in the last 72 hours.  Invalid input(s): FREET3   Coagulation profile No results for input(s): INR, PROTIME in the last 168 hours. ------------------------------------------------------------------------------------------------------------------- No results for input(s): DDIMER in the last 72 hours. -------------------------------------------------------------------------------------------------------------------  Cardiac Enzymes  Recent Labs Lab 03/18/16 0332  TROPONINI <0.03   ------------------------------------------------------------------------------------------------------------------ Invalid input(s): POCBNP  ---------------------------------------------------------------------------------------------------------------  Urinalysis No results found for: COLORURINE, APPEARANCEUR, LABSPEC, PHURINE, GLUCOSEU, HGBUR, BILIRUBINUR, KETONESUR, PROTEINUR, UROBILINOGEN, NITRITE, LEUKOCYTESUR   RADIOLOGY: Dg Chest Port 1 View  03/18/2016  CLINICAL DATA:  Acute onset of left-sided chest pain. Initial encounter. EXAM: PORTABLE CHEST 1 VIEW COMPARISON:  Chest radiograph from 02/17/2013 FINDINGS: The lungs are well-aerated and clear. There is no evidence of focal opacification, pleural effusion or pneumothorax. The cardiomediastinal silhouette is borderline normal in size. No acute osseous abnormalities are seen. Clips are noted overlying the left upper quadrant. IMPRESSION: No acute cardiopulmonary process seen. Electronically Signed   By: Garald Balding M.D.   On: 03/18/2016 03:52    EKG: Orders placed  or performed during the hospital encounter of 03/18/16  . EKG 12-Lead  . EKG 12-Lead  . ED EKG  . ED EKG    IMPRESSION AND PLAN: 55 year old male  patient presented to the emergency room with chest pain. Patient has history of congestive heart failure, Hodgkin's disease, hyperlipidemia and diabetes mellitus.  Admitting diagnosis 1. Chest pain rule out myocardial infarction 2. Congestive heart failure which is stable 3. Type 2 diabetes mellitus 4. Hyperlipidemia Treatment plan Admit patient to telemetry observation bed Aspirin 81 mg orally daily Check troponin to rule out ischemia Check echocardiogram Insulin via pump for management of diabetes mellitus, patient is on insulin pump at home Diabetic diet Resume beta blocker and statin medication DVT prophylaxis subcutaneous heparin Cardiology consultation Supportive care  All the records are reviewed and case discussed with ED provider. Management plans discussed with the patient, family and they are in agreement.  CODE STATUS:FULL Code Status History    This patient does not have a recorded code status. Please follow your organizational policy for patients in this situation.       TOTAL TIME TAKING CARE OF THIS PATIENT: 50 minutes.    Saundra Shelling M.D on 03/18/2016 at 5:17 AM  Between 7am to 6pm - Pager - [phone]  After 6pm go to [email or web address] - password [email or web address] [place] [facility]  Office  [phone]  CC: Primary care physician; Ashok Cordia, PA-C

## 2016-03-18 NOTE — Consult Note (Signed)
[facility] Cardiology  CARDIOLOGY CONSULT NOTE  Patient ID: Stephen Harmon MRN: [id number] DOB/AGE: February 26, 1961 55 y.o.  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 The patient has been in his usual state of health until early this morning, when he awoke with left-sided chest pain, rated 9 out of 10. The patient presented to [facility] emergency room where EKG was nondiagnostic. Initial troponin was negative. Chest pain improved after morphine. Patient still has mild to moderate residual chest discomfort without radiation, nausea, vomiting, diaphoresis. The patient does have chronic exertional dyspnea.  Review of systems complete and found to be negative unless listed above     Past Medical History  Diagnosis Date  . CHF (congestive heart failure) ([place])   . Diabetes mellitus without complication ([place])   . Cancer ([place])     Hodgkin's Lymphoma in the past  . Thyroid disease   . Hyperlipidemia   . Hodgkin's lymphoma [facility])     Past Surgical History  Procedure Laterality Date  . Splenectomy    . Cholecystectomy    . Exploratory laparotomy    . Foot surgery Bilateral     Prescriptions prior to admission  Medication Sig Dispense Refill Last Dose  . allopurinol (ZYLOPRIM) 100 MG tablet Take 1 tablet (100 mg total) by mouth daily. 30 tablet 6 Unknown at Unknown  . aspirin 81 MG tablet Take 81 mg by mouth daily.   Unknown at Unknown  . atorvastatin (LIPITOR) 10 MG tablet Take 10 mg by mouth daily.   Unknown at Unknown  . carvedilol (COREG) 6.25 MG tablet Take 6.25 mg by mouth 2 (two) times daily with a meal.   Unknown at Unknown  . furosemide (LASIX) 20 MG tablet Take 20 mg by mouth  daily.   Unknown at Unknown  . glucose blood test strip 1 each by Other route every 4 (four) hours. Use as instructed   Unknown at Unknown  . insulin regular (NOVOLIN R,HUMULIN R) 100 units/mL injection Inject 1-40 Units into the skin daily. Use up to 40 units per day in insulin pump as directed   Unknown at Unknown  . levothyroxine (SYNTHROID, LEVOTHROID) 25 MCG tablet Take 1 tablet (25 mcg total) by mouth daily before breakfast. 30 tablet 6 Unknown at Unknown  . lisinopril (PRINIVIL,ZESTRIL) 2.5 MG tablet Take 2.5 mg by mouth daily.   Unknown at Unknown  . metFORMIN (GLUCOPHAGE-XR) 500 MG 24 hr tablet Take 500-1,000 mg by mouth daily with supper. Take one tablet (500 mg) at supper and after one week, increase to 2 tablets (1,000 mg) daily at supper.   Unknown at Unknown  . spironolactone (ALDACTONE) 25 MG tablet Take 25 mg by mouth daily.   Unknown at Unknown   Social History   Social History  . Marital Status: Married    Spouse Name: N/A  . Number of Children: N/A  . Years of Education: N/A   Occupational History  . retired Engineer, agricultural.    Social History Main Topics  . Smoking status: Never Smoker   . Smokeless tobacco: Not on file  . Alcohol Use: 0.0 oz/week    0 Standard drinks or equivalent per week     Comment: wine occasionally  . Drug Use: Not on file  . Sexual Activity:  Not on file   Other Topics Concern  . Not on file   Social History Narrative    Family History  Problem Relation Age of Onset  . Heart disease Father   . Diabetes Father       Review of systems complete and found to be negative unless listed above      PHYSICAL EXAM  General: Well developed, well nourished, in no acute distress HEENT:  Normocephalic and atramatic Neck:  No JVD.  Lungs: Clear bilaterally to auscultation and percussion. Heart: HRRR . Normal S1 and S2 without gallops or murmurs.  Abdomen: Bowel sounds are positive, abdomen soft and non-tender  Msk:  Back normal, normal gait.  Normal strength and tone for age. Extremities: No clubbing, cyanosis or edema.   Neuro: Alert and oriented X 3. Psych:  Good affect, responds appropriately  Labs:   Lab Results  Component Value Date   WBC 13.4* 03/18/2016   HGB 13.5 03/18/2016   HCT 39.2* 03/18/2016   MCV 95.8 03/18/2016   PLT 315 03/18/2016    Recent Labs Lab 03/18/16 0332  NA 135  K 4.5  CL 105  CO2 18*  BUN 32*  CREATININE 1.64*  CALCIUM 10.1  GLUCOSE 237*   Lab Results  Component Value Date   CKTOTAL 124 02/18/2013   TROPONINI <0.03 03/18/2016    Lab Results  Component Value Date   CHOL 106 02/19/2013   Lab Results  Component Value Date   HDL 35* 02/19/2013   Lab Results  Component Value Date   LDLCALC 41 02/19/2013   Lab Results  Component Value Date   TRIG 149 02/19/2013   No results found for: CHOLHDL No results found for: LDLDIRECT    Radiology: Dg Chest Port 1 View  03/18/2016  CLINICAL DATA:  Acute onset of left-sided chest pain. Initial encounter. EXAM: PORTABLE CHEST 1 VIEW COMPARISON:  Chest radiograph from 02/17/2013 FINDINGS: The lungs are well-aerated and clear. There is no evidence of focal opacification, pleural effusion or pneumothorax. The cardiomediastinal silhouette is borderline normal in size. No acute osseous abnormalities are seen. Clips are noted overlying the left upper quadrant. IMPRESSION: No acute cardiopulmonary process seen. Electronically Signed   By: Garald Balding M.D.   On: 03/18/2016 03:52    EKG: Normal sinus rhythm  ASSESSMENT AND PLAN:   1. Chest pain, with atypical features, nondiagnostic ECG, initial negative troponin 2. Nonischemic mild to moderate cardiac myopathy 3. Chronic combined systolic and diastolic CHF  Recommendations  1. Continue current medications 2. Defer full dose anticoagulation 3. Repeat troponin pending 4. Consider Lexiscan sestamibi study which could be done as outpatient second troponin negative and patient comes chest  pain-free   Signed: Caydon Feasel MD,PhD, [facility] 03/18/2016, 8:14 AM

## 2016-03-18 NOTE — ED Notes (Signed)
Patient reports chest pain to left chest since 0130 this am. States also woke him up last night and reports having intermittent chest pain through the day yesterday. Patient has h/o CHF. When asked what pain feels like, patient states "I don't know, it just hurts".

## 2016-03-18 NOTE — Progress Notes (Signed)
Md Melynda Ripple made aware of troponin of 0.04. Orders given to recheck in 6 hours. Will continue to monitor.  Iran Sizer M

## 2016-03-18 NOTE — ED Notes (Signed)
Dr. Owens Shark at bedside to evaluate the Pt after nitro administration.

## 2016-03-18 NOTE — Progress Notes (Signed)
 \  Inpatient Diabetes Program Recommendations  AACE/ADA: New Consensus Statement on Inpatient Glycemic Control (2015)  Target Ranges:  Prepandial:   less than 140 mg/dL      Peak postprandial:   less than 180 mg/dL (1-2 hours)      Critically ill patients:  140 - 180 mg/dL   Review of Glycemic Control  Results for Kimbell, ROSCO ELMORE (MRN [id number]) as of 03/18/2016 09:13  Ref. Range 03/18/2016 07:37  Glucose-Capillary Latest Ref Range: 65-99 mg/dL 245 (H)    Diabetes history: Type 2 diabetes Outpatient Diabetes medications: Medtronic insulin pump U-500 Current orders for Inpatient glycemic control:   Per Dr. Joycie Peek note dated 02/09/16 "He is using a MiniMed Medtronic insulin pump. He uses Humulin U-500 insulin in the pump. A1c now at 8.7%. Diabetes remains uncontrolled. Diabetes is complicated by microalbuminuria and stage 3 CKD. Insulin pump and Contour Next glucometer were downloaded and reviewed.   Basal settings 12 am 0.55 units/hr 6:30 am 0.65 units/hr 24-hour basal   Bolus settings IC ratio 12 am 1:5, 12 pm 1:7, 5 pm 1:5 Sensitivity 12 am 35 Target 110-110 Active insulin time 5 hours"  I have reviewed the pump settings with the patient- they are as stated above.  Patient checks sugars tid and hs and gives insulin accordingly.  He generally does not check blood sugar in the middle of the night but wakes when he does not feel well and gives insulin if needed.   Patient takes via pre-set pump settings;  1 unit for every 5 grams of carb until 10am 1 unit of insulin per 7 grams of carb from 10am to 4:30pm 1 unit of insulin per 5 grams of carb from 4:30 pm to midnight  1 unit will drop his blood sugar 35 points  His insulin is active for 5 hours  I have reviewed the need to have supplies at the bedside including U500 insulin- his wife will bring it to the hospital   Site changed on 03/16/16 @ 8:03am   Spoke with patient and family re: insulin policy, reviewed contract.  Questioned answered.   Gentry Fitz, RN, BA, MHA, CDE Diabetes Coordinator Inpatient Diabetes Program  [phone] (Team Pager) [phone] ([place]) 03/18/2016 9:13 AM

## 2016-03-18 NOTE — Care Management Note (Signed)
Case Management Note  Patient Details  Name: Stephen Harmon MRN: [id number] Date of Birth: [birth year]  Subjective/Objective:   Cm assessment for discharge planning. Admitted with chest pain, r/o MI . Troponin's negative at this tie. Met with patient and his wife at bedside. He is independent, drives and requires no assistive devices. PCP is Ambulance person. He is active with [facility] cardiology. Denies issues accessing medical care, copays or other financial concerns.    Action/Plan: Not home bound. No needs anticipated.   Expected Discharge Date:                  Expected Discharge Plan:  Home/Self Care  In-House Referral:     Discharge [place] not met per provider  Post Acute Care Choice:    Choice offered to:     DME Arranged:    DME Agency:     HH Arranged:    HH Agency:     Status of Service:  Completed, signed off  Medicare Important Message Given:    Date Medicare IM Given:    Medicare IM give by:    Date Additional Medicare IM Given:    Additional Medicare Important Message give by:     If discussed at [place] of Stay Meetings, dates discussed:    Additional Comments:  Jolly Mango, RN 03/18/2016, 9:40 AM

## 2016-03-19 ENCOUNTER — Encounter: Payer: Self-pay | Admitting: Radiology

## 2016-03-19 ENCOUNTER — Ambulatory Visit: Payer: Medicare Other

## 2016-03-19 ENCOUNTER — Observation Stay
Admit: 2016-03-19 | Discharge: 2016-03-19 | Disposition: A | Discharge status: Home / Self Care | Payer: Medicare Other | Attending: Internal Medicine | Admitting: Internal Medicine

## 2016-03-19 DIAGNOSIS — R0789 Other chest pain: Secondary | ICD-10-CM | POA: Diagnosis not present

## 2016-03-19 LAB — NM MYOCAR MULTI W/SPECT W/WALL MOTION / EF
CHL CUP NUCLEAR SRS: 1
CHL CUP NUCLEAR SSS: 0
CHL CUP RESTING HR STRESS: 81 {beats}/min
CHL CUP STRESS STAGE 1 HR: 74 {beats}/min
CHL CUP STRESS STAGE 2 GRADE: 0 %
CHL CUP STRESS STAGE 2 SPEED: 0 mph
CHL CUP STRESS STAGE 3 GRADE: 0 %
CHL CUP STRESS STAGE 3 HR: 100 {beats}/min
CHL CUP STRESS STAGE 4 HR: 99 {beats}/min
CHL CUP STRESS STAGE 5 SPEED: 0 mph
CSEPED: 1 min
CSEPPMHR: 61 %
Estimated workload: 1 METS
Exercise duration (sec): 7 s
LVDIAVOL: 111 mL (ref 62–150)
LVSYSVOL: 39 mL
NUC STRESS TID: 1.02
Peak HR: 100 {beats}/min
SDS: 0
Stage 1 Grade: 0 %
Stage 1 Speed: 0 mph
Stage 2 HR: 73 {beats}/min
Stage 3 Speed: 0 mph
Stage 4 DBP: 65 mmHg
Stage 4 Grade: 0 %
Stage 4 SBP: 138 mmHg
Stage 4 Speed: 0 mph
Stage 5 DBP: 61 mmHg
Stage 5 Grade: 0 %
Stage 5 HR: 89 {beats}/min
Stage 5 SBP: 118 mmHg

## 2016-03-19 LAB — GLUCOSE, CAPILLARY
GLUCOSE-CAPILLARY: 276 mg/dL — AB (ref 65–99)
Glucose-Capillary: 211 mg/dL — ABNORMAL HIGH (ref 65–99)

## 2016-03-19 LAB — LIPID PANEL
CHOL/HDL RATIO: 4.2 ratio
Cholesterol: 137 mg/dL (ref 0–200)
HDL: 33 mg/dL — AB (ref >40)
LDL CALC: 48 mg/dL (ref 0–99)
Triglycerides: 280 mg/dL — ABNORMAL HIGH (ref <150)
VLDL: 56 mg/dL — ABNORMAL HIGH (ref 0–40)

## 2016-03-19 LAB — BASIC METABOLIC PANEL
Anion gap: 10 (ref 5–15)
BUN: 34 mg/dL — AB (ref 6–20)
CO2: 21 mmol/L — ABNORMAL LOW (ref 22–32)
CREATININE: 1.75 mg/dL — AB (ref 0.61–1.24)
Calcium: 9.5 mg/dL (ref 8.9–10.3)
Chloride: 103 mmol/L (ref 101–111)
GFR calc Af Amer: 48 mL/min — ABNORMAL LOW (ref >60)
GFR calc non Af Amer: 41 mL/min — ABNORMAL LOW (ref >60)
Glucose, Bld: 279 mg/dL — ABNORMAL HIGH (ref 65–99)
Potassium: 4.6 mmol/L (ref 3.5–5.1)
SODIUM: 134 mmol/L — AB (ref 135–145)

## 2016-03-19 LAB — CBC
HEMATOCRIT: 36.8 % — AB (ref 40.0–52.0)
HEMOGLOBIN: 12.6 g/dL — AB (ref 13.0–18.0)
MCH: 32.9 pg (ref 26.0–34.0)
MCHC: 34.2 g/dL (ref 32.0–36.0)
MCV: 96.1 fL (ref 80.0–100.0)
Platelets: 296 10*3/uL (ref 150–440)
RBC: 3.83 MIL/uL — ABNORMAL LOW (ref 4.40–5.90)
RDW: 14 % (ref 11.5–14.5)
WBC: 11.9 10*3/uL — ABNORMAL HIGH (ref 3.8–10.6)

## 2016-03-19 LAB — TROPONIN I: Troponin I: <0.03 ng/mL (ref <0.031)

## 2016-03-19 MED ORDER — REGADENOSON 0.4 MG/5ML IV SOLN
0.4000 mg | Freq: Once | INTRAVENOUS | Status: AC
  Administered 2016-03-19: 0.4 mg via INTRAVENOUS
  Filled 2016-03-19: qty 5

## 2016-03-19 MED ORDER — OXYCODONE-ACETAMINOPHEN 5-325 MG PO TABS: 1.0000 | ORAL_TABLET | Freq: Four times a day (QID) | ORAL | Status: DC | PRN

## 2016-03-19 MED ORDER — TECHNETIUM TC 99M SESTAMIBI - CARDIOLITE
30.9570 | Freq: Once | INTRAVENOUS | Status: AC | PRN
  Administered 2016-03-19: 30.957 via INTRAVENOUS

## 2016-03-19 MED ORDER — TECHNETIUM TC 99M SESTAMIBI - CARDIOLITE
13.0000 | Freq: Once | INTRAVENOUS | Status: AC | PRN
  Administered 2016-03-19: 09:00:00 11.96 via INTRAVENOUS

## 2016-03-19 MED ORDER — FUROSEMIDE 20 MG PO TABS: 20.0000 mg | ORAL_TABLET | Freq: Every day | ORAL | Status: DC | PRN

## 2016-03-19 NOTE — Progress Notes (Signed)
Patient received discharge instructions, pt verbalized understanding. IV was removed with no signs of infection. Dressing clean, dry intact. No skin tears or wounds present. Prescription was printed and given to patient. Patient was escorted out with staff member via wheelchair via private auto. No further needs from care management team.  

## 2016-03-19 NOTE — Progress Notes (Signed)
*  PRELIMINARY RESULTS* Echocardiogram 2D Echocardiogram has been performed.  Laqueta Jean Hege 03/19/2016, 8:28 AM

## 2016-03-19 NOTE — Progress Notes (Signed)
Inpatient Diabetes Program Recommendations  AACE/ADA: New Consensus Statement on Inpatient Glycemic Control (2015)  Target Ranges:  Prepandial:   less than 140 mg/dL      Peak postprandial:   less than 180 mg/dL (1-2 hours)      Critically ill patients:  140 - 180 mg/dL   Review of Glycemic Control  Results for Canal, EXEQUIEL ZILKA (MRN [id number]) as of 03/19/2016 12:45  Ref. Range 03/18/2016 11:31 03/18/2016 16:27 03/18/2016 20:39 03/19/2016 01:35 03/19/2016 11:21  Glucose-Capillary Latest Ref Range: 65-99 mg/dL 304 (H) 254 (H) 213 (H) 276 (H) 211 (H)    Diabetes history: Type 2 diabetes Outpatient Diabetes medications: Medtronic insulin pump U-500 Current orders for Inpatient glycemic control:   Per Dr. Joycie Peek note dated 02/09/16 "He is using a MiniMed Medtronic insulin pump. He uses Humulin U-500 insulin in the pump. A1c now at 8.7%. Diabetes remains uncontrolled. Diabetes is complicated by microalbuminuria and stage 3 CKD. Insulin pump and Contour Next glucometer were downloaded and reviewed.   Basal settings 12 am 0.55 units/hr 6:30 am 0.65 units/hr 24-hour basal   Bolus settings IC ratio 12 am 1:5, 12 pm 1:7, 5 pm 1:5 Sensitivity 12 am 35 Target 110-110 Active insulin time 5 hours"   Patient takes via pre-set pump settings;  1 unit for every 5 grams of carb until 10am 1 unit of insulin per 7 grams of carb from 10am to 4:30pm 1 unit of insulin per 5 grams of carb from 4:30 pm to midnight  1 unit will drop his blood sugar 35 points  His insulin is active for 5 hours  Site changed on 03/16/16 @ 8:03am    Patient went to give insulin via pump last night for a blood sugar of 213mg /dl but the pump said he didn't need any because he still had active insulin.   He gave 4.7 units for CBG of 2:18am.  Patient was NPO for stress test today, returning at 11:15am- he corrected for the elevated CBG at that time.   Anxious to get results- I have requested he call Dr. Gabriel Carina by mid-week  if CBG are not improving.  Patient feels confident that blood sugars will improve when Metformin is re-started.  Gentry Fitz, RN, BA, MHA, CDE Diabetes Coordinator Inpatient Diabetes Program  [phone] (Team Pager) [phone] ([place]) 03/19/2016 12:51 PM

## 2016-03-19 NOTE — Discharge Summary (Signed)
[name] NAME: Stephen Harmon    MR#:  [id number]  DATE OF BIRTH:  February 01, 1961  DATE OF ADMISSION:  03/18/2016 ADMITTING PHYSICIAN: Saundra Shelling, MD  DATE OF DISCHARGE: 03/19/16  PRIMARY CARE PHYSICIAN: Ashok Cordia, PA-C    ADMISSION DIAGNOSIS:  Chest Pain  DISCHARGE DIAGNOSIS:  Principal Problem:   Chest pain at rest Active Problems:   Chest pain   SECONDARY DIAGNOSIS:   Past Medical History  Diagnosis Date  . CHF (congestive heart failure) ([place])   . Diabetes mellitus without complication ([place])   . Cancer ([place])     Hodgkin's Lymphoma in the past  . Thyroid disease   . Hyperlipidemia   . Hodgkin's lymphoma [facility])     HOSPITAL COURSE:   55 year old male with past medical history significant for Hodgkin's lymphoma, non-ischemic cardiomyopathy with combined heart failure since to the hospital secondary to left-sided chest pain.  #1 left-sided chest pain- atypical features. musculoskeletal. -negative troponins.  - Stress test with fixed inferior defect, but no active ischemia -Appreciate cardiology consult. -Echocardiogram done - CT chest today with no acute findings, 68mm right lung nodule- outpatient follow up  #2 chronic combined heart failure-last echo as an outpatient was from November 2016. EF is 40% at the time. -Continue as needed Lasix. On Coreg, aspirin, statin and lisinopril.  #3 diabetes mellitus-patient has insulin pump. Appreciate diabetes coordinator's input. Continue current recommendations. - endocrinology f/u with Dr. Gabriel Carina as outpatient  #4 Hodgkin's lymphoma- in remission  #5 CKD- baseline cr about 1.6, cr is at baseline. Likely diabetic nephropathy  Discharge today  DISCHARGE CONDITIONS:   Stable  CONSULTS OBTAINED:  Treatment Team:  Isaias Cowman, MD  DRUG ALLERGIES:   Allergies  Allergen Reactions  . Amoxicillin Hives    DISCHARGE MEDICATIONS:   Current  Discharge Medication List    START taking these medications   Details  oxyCODONE-acetaminophen (PERCOCET/ROXICET) 5-325 MG tablet Take 1 tablet by mouth every 6 (six) hours as needed for moderate pain. Qty: 20 tablet, Refills: 0      CONTINUE these medications which have CHANGED   Details  furosemide (LASIX) 20 MG tablet Take 1 tablet (20 mg total) by mouth daily as needed for fluid or edema. Qty: 30 tablet, Refills: 0      CONTINUE these medications which have NOT CHANGED   Details  allopurinol (ZYLOPRIM) 100 MG tablet Take 1 tablet (100 mg total) by mouth daily. Qty: 30 tablet, Refills: 6    aspirin 81 MG tablet Take 81 mg by mouth daily.    atorvastatin (LIPITOR) 10 MG tablet Take 10 mg by mouth daily.    carvedilol (COREG) 6.25 MG tablet Take 6.25 mg by mouth 2 (two) times daily with a meal.    glucose blood test strip 1 each by Other route every 4 (four) hours. Use as instructed    insulin regular (NOVOLIN R,HUMULIN R) 100 units/mL injection Inject 1-40 Units into the skin daily. Use up to 40 units per day in insulin pump as directed    levothyroxine (SYNTHROID, LEVOTHROID) 25 MCG tablet Take 1 tablet (25 mcg total) by mouth daily before breakfast. Qty: 30 tablet, Refills: 6    lisinopril (PRINIVIL,ZESTRIL) 2.5 MG tablet Take 2.5 mg by mouth daily.    metFORMIN (GLUCOPHAGE-XR) 500 MG 24 hr tablet Take 500-1,000 mg by mouth daily with supper. Take one tablet (500 mg) at supper and after one week, increase to 2 tablets (  1,000 mg) daily at supper.    spironolactone (ALDACTONE) 25 MG tablet Take 25 mg by mouth daily.         DISCHARGE INSTRUCTIONS:   1. PCP f/u in 1-2 weeks 2. Cardiology f/u in 2 weeks  If you experience worsening of your admission symptoms, develop shortness of breath, life threatening emergency, suicidal or homicidal thoughts you must seek medical attention immediately by calling 911 or calling your MD immediately  if symptoms less severe.  You  Must read complete instructions/literature along with all the possible adverse reactions/side effects for all the Medicines you take and that have been prescribed to you. Take any new Medicines after you have completely understood and accept all the possible adverse reactions/side effects.   Please note  You were cared for by a hospitalist during your hospital stay. If you have any questions about your discharge medications or the care you received while you were in the hospital after you are discharged, you can call the unit and asked to speak with the hospitalist on call if the hospitalist that took care of you is not available. Once you are discharged, your primary care physician will handle any further medical issues. Please note that NO REFILLS for any discharge medications will be authorized once you are discharged, as it is imperative that you return to your primary care physician (or establish a relationship with a primary care physician if you do not have one) for your aftercare needs so that they can reassess your need for medications and monitor your lab values.    Today   CHIEF COMPLAINT:   Chief Complaint  Patient presents with  . Chest Pain    VITAL SIGNS:  Blood pressure 128/59, pulse 76, temperature 98.1 F (36.7 C), temperature source Oral, resp. rate 16, height 5\' 8"  (1.727 m), weight 105.597 kg (232 lb 12.8 oz), SpO2 96 %.  I/O:   Intake/Output Summary (Last 24 hours) at 03/19/16 1525 Last data filed at 03/19/16 0510  Gross per 24 hour  Intake      0 ml  Output   1400 ml  Net  -1400 ml    PHYSICAL EXAMINATION:   Physical Exam  GENERAL: 55 y.o.-year-old patient lying in the bed with no acute distress.  EYES: Pupils equal, round, reactive to light and accommodation. No scleral icterus. Extraocular muscles intact.  HEENT: Head atraumatic, normocephalic. Oropharynx and nasopharynx clear.  NECK: Supple, no jugular venous distention. No thyroid enlargement, no  tenderness.  LUNGS: Normal breath sounds bilaterally, no wheezing, rales,rhonchi or crepitation. No use of accessory muscles of respiration.  CARDIOVASCULAR: S1, S2 normal. No murmurs, rubs, or gallops.  ABDOMEN: Soft, nontender, nondistended. Bowel sounds present. No organomegaly or mass.  EXTREMITIES: No pedal edema, cyanosis, or clubbing.  NEUROLOGIC: Cranial nerves II through XII are intact. Muscle strength 5/5 in all extremities. Sensation intact. Gait not checked.  PSYCHIATRIC: The patient is alert and oriented x 3.  SKIN: No obvious rash, lesion, or ulcer.   DATA REVIEW:   CBC  Recent Labs Lab 03/19/16 0300  WBC 11.9*  HGB 12.6*  HCT 36.8*  PLT 296    Chemistries   Recent Labs Lab 03/19/16 0300  NA 134*  K 4.6  CL 103  CO2 21*  GLUCOSE 279*  BUN 34*  CREATININE 1.75*  CALCIUM 9.5    Cardiac Enzymes  Recent Labs Lab 03/19/16 0300  TROPONINI <0.03    Microbiology Results  No results found for this or any  previous visit.  RADIOLOGY:  Ct Chest Wo Contrast  03/18/2016  CLINICAL DATA:  Chest pain for 1 day.  History of CHF. EXAM: CT CHEST WITHOUT CONTRAST TECHNIQUE: Multidetector CT imaging of the chest was performed following the standard protocol without IV contrast. COMPARISON:  03/18/2016 FINDINGS: Neck base and axilla: No masses or enlarged lymph nodes. Visualized thyroid is unremarkable. Mediastinum and hila: Heart normal in size and configuration. Small focus of left coronary artery calcifications. Great vessels are normal in caliber. No mediastinal or hilar masses or pathologically enlarged lymph nodes. Lungs and pleura: In the right middle lobe adjacent to the minor fissure there is a 3.8 x 2.0 mm nodule (2.9 mm mean diameter) on image 66 of series 3. No other nodules. No masses. Minor dependent subsegmental atelectasis. No evidence of pneumonia or edema. No pleural effusion or pneumothorax. Limited upper abdomen: Fatty infiltration of liver. Status  post cholecystectomy. Nonobstructing right intrarenal stone. Left renal cortical thinning. Small nonobstructing stone in the upper pole the left kidney. Status post spleenectomy. Musculoskeletal:  Unremarkable. IMPRESSION: 1. No acute findings. No evidence of congestive heart failure or pneumonia. 2. 2.9 mm right middle lobe pulmonary nodule. No follow-up needed if patient is low-risk. Non-contrast chest CT can be considered in 12 months if patient is high-risk. This recommendation follows the consensus statement: Guidelines for Management of Incidental Pulmonary Nodules Detected on CT Images:From the Fleischner Society 2017; published online before print (10.1148/radiol.IJ:2314499). 3. Hepatic steatosis. Electronically Signed   By: Lajean Manes M.D.   On: 03/18/2016 16:18   Nm Myocar Multi W/spect W/wall Motion / Ef  03/19/2016   There was no ST segment deviation noted during stress.  Defect 1: There is a small defect of mild severity present in the mid inferior location.  Findings consistent with prior myocardial infarction.  This is a low risk study.  The left ventricular ejection fraction is normal (55-65%).    Dg Chest Port 1 View  03/18/2016  CLINICAL DATA:  Acute onset of left-sided chest pain. Initial encounter. EXAM: PORTABLE CHEST 1 VIEW COMPARISON:  Chest radiograph from 02/17/2013 FINDINGS: The lungs are well-aerated and clear. There is no evidence of focal opacification, pleural effusion or pneumothorax. The cardiomediastinal silhouette is borderline normal in size. No acute osseous abnormalities are seen. Clips are noted overlying the left upper quadrant. IMPRESSION: No acute cardiopulmonary process seen. Electronically Signed   By: Garald Balding M.D.   On: 03/18/2016 03:52    EKG:   Orders placed or performed during the hospital encounter of 03/18/16  . EKG 12-Lead  . EKG 12-Lead  . ED EKG  . ED EKG      Management plans discussed with the patient, family and they are in  agreement.  CODE STATUS:     Code Status Orders        Start     Ordered   03/18/16 0735  Full code   Continuous     03/18/16 0734    Code Status History    Date Active Date Inactive Code Status Order ID Comments User Context   This patient has a current code status but no historical code status.      TOTAL TIME TAKING CARE OF THIS PATIENT: 38  minutes.    Gladstone Lighter M.D on 03/19/2016 at 3:25 PM  Between 7am to 6pm - Pager - [phone]  After 6pm go to [email or web address] - password [email or web address] [facility]  [facility]  Office  [phone]  CC: Primary care  physician; Ashok Cordia, PA-C

## 2016-03-20 LAB — ECHOCARDIOGRAM COMPLETE
HEIGHTINCHES: 68 in
Weight: 3724.8 oz

## 2016-04-01 NOTE — ED Provider Notes (Signed)
[facility] Emergency Department Provider Note  ____________________________________________  Time seen: 3:15 AM  I have reviewed the triage vital signs and the nursing notes.   HISTORY  Chief Complaint Chest Pain      HPI Stephen Harmon is a 55 y.o. male with history of CHF presents with intermittent chest pain times one day. Patient states that 1:30 AM this morning he awoke to left-sided chest pain that was nonradiating. Patient denies any dyspnea no diaphoresis no dizziness. Patient states that his current pain score is 6 out of 10.    Past Medical History  Diagnosis Date  . CHF (congestive heart failure) ([place])   . Diabetes mellitus without complication ([place])   . Cancer ([place])     Hodgkin's Lymphoma in the past  . Thyroid disease   . Hyperlipidemia   . Hodgkin's lymphoma [facility])     Patient Active Problem List   Diagnosis Date Noted  . Chest pain at rest 03/18/2016  . Chest pain 03/18/2016  . Hodgkin lymphoma ([place]) 01/30/2016  . Diabetes mellitus ([place]) 01/30/2016  . Combined fat and carbohydrate induced hyperlipemia 01/30/2016  . Edema leg 10/15/2015  . Breathlessness on exertion 10/01/2015  . Benign essential HTN 04/02/2015  . Chronic kidney disease (CKD), stage III (moderate) 12/30/2014  . Acquired hypothyroidism 12/30/2014  . History of surgical procedure 12/30/2014  . Type 2 diabetes mellitus with other diabetic kidney complication ([place]) 0000000  . Chronic systolic heart failure ([place]) 10/03/2014    Past Surgical History  Procedure Laterality Date  . Splenectomy    . Cholecystectomy    . Exploratory laparotomy    . Foot surgery Bilateral   . Hernia repair Bilateral     both hernia repair surgeries as a child  . Bone marrow transplant      Current Outpatient Rx  Name  Route  Sig  Dispense  Refill  . allopurinol (ZYLOPRIM) 100 MG tablet   Oral   Take 1 tablet (100 mg total) by mouth daily.   30 tablet   6   . aspirin 81  MG tablet   Oral   Take 81 mg by mouth daily.         Marland Kitchen atorvastatin (LIPITOR) 10 MG tablet   Oral   Take 10 mg by mouth daily.         . carvedilol (COREG) 6.25 MG tablet   Oral   Take 6.25 mg by mouth 2 (two) times daily with a meal.         . glucose blood test strip   Other   1 each by Other route every 4 (four) hours. Use as instructed         . insulin regular (NOVOLIN R,HUMULIN R) 100 units/mL injection   Subcutaneous   Inject 1-40 Units into the skin daily. Use up to 40 units per day in insulin pump as directed         . levothyroxine (SYNTHROID, LEVOTHROID) 25 MCG tablet   Oral   Take 1 tablet (25 mcg total) by mouth daily before breakfast.   30 tablet   6   . lisinopril (PRINIVIL,ZESTRIL) 2.5 MG tablet   Oral   Take 2.5 mg by mouth daily.         . metFORMIN (GLUCOPHAGE-XR) 500 MG 24 hr tablet   Oral   Take 500-1,000 mg by mouth daily with supper. Take one tablet (500 mg) at supper and after one week, increase to 2  tablets (1,000 mg) daily at supper.         Marland Kitchen spironolactone (ALDACTONE) 25 MG tablet   Oral   Take 25 mg by mouth daily.         . furosemide (LASIX) 20 MG tablet   Oral   Take 1 tablet (20 mg total) by mouth daily as needed for fluid or edema.   30 tablet   0   . oxyCODONE-acetaminophen (PERCOCET/ROXICET) 5-325 MG tablet   Oral   Take 1 tablet by mouth every 6 (six) hours as needed for moderate pain.   20 tablet   0     Allergies Amoxicillin  Family History  Problem Relation Age of Onset  . Heart disease Father   . Diabetes Father     Social History Social History  Substance Use Topics  . Smoking status: Never Smoker   . Smokeless tobacco: None  . Alcohol Use: 0.0 oz/week    0 Standard drinks or equivalent per week     Comment: wine occasionally    Review of Systems  Constitutional: Negative for fever. Eyes: Negative for visual changes. ENT: Negative for sore throat. Cardiovascular:Positive for chest  pain. Respiratory: Negative for shortness of breath. Gastrointestinal: Negative for abdominal pain, vomiting and diarrhea. Genitourinary: Negative for dysuria. Musculoskeletal: Negative for back pain. Skin: Negative for rash. Neurological: Negative for headaches, focal weakness or numbness.   10-point ROS otherwise negative.  ____________________________________________   PHYSICAL EXAM:  VITAL SIGNS: ED Triage Vitals  Enc Vitals Group     BP 03/18/16 0306 152/66 mmHg     Pulse Rate 03/18/16 0306 80     Resp 03/18/16 0306 20     Temp 03/18/16 0306 97.8 F (36.6 C)     Temp Source 03/18/16 0306 Oral     SpO2 03/18/16 0306 96 %     Weight 03/18/16 0306 235 lb (106.595 kg)     Height 03/18/16 0306 5\' 8"  (1.727 m)     Head Cir --      Peak Flow --      Pain Score 03/18/16 0307 8     Pain Loc --      Pain Edu? --      Excl. in [place]? --      Constitutional: Alert and oriented. Well appearing and in no distress. Eyes: Conjunctivae are normal. PERRL. Normal extraocular movements. ENT   Head: Normocephalic and atraumatic.   Nose: No congestion/rhinnorhea.   Mouth/Throat: Mucous membranes are moist.   Neck: No stridor. Hematological/Lymphatic/Immunilogical: No cervical lymphadenopathy. Cardiovascular: Normal rate, regular rhythm. Normal and symmetric distal pulses are present in all extremities. No murmurs, rubs, or gallops. Respiratory: Normal respiratory effort without tachypnea nor retractions. Breath sounds are clear and equal bilaterally. No wheezes/rales/rhonchi. Gastrointestinal: Soft and nontender. No distention. There is no CVA tenderness. Genitourinary: deferred Musculoskeletal: Nontender with normal range of motion in all extremities. No joint effusions.  No lower extremity tenderness nor edema. Neurologic:  Normal speech and language. No gross focal neurologic deficits are appreciated. Speech is normal.  Skin:  Skin is warm, dry and intact. No rash  noted. Psychiatric: Mood and affect are normal. Speech and behavior are normal. Patient exhibits appropriate insight and judgment.  ____________________________________________    LABS (pertinent positives/negatives)  Labs Reviewed  BASIC METABOLIC PANEL - Abnormal; Notable for the following:    CO2 18 (*)    Glucose, Bld 237 (*)    BUN 32 (*)    Creatinine, Ser 1.64 (*)  GFR calc non Af Amer 45 (*)    GFR calc Af Amer 52 (*)    All other components within normal limits  CBC - Abnormal; Notable for the following:    WBC 13.4 (*)    RBC 4.10 (*)    HCT 39.2 (*)    All other components within normal limits  TROPONIN I - Abnormal; Notable for the following:    Troponin I 0.04 (*)    All other components within normal limits  GLUCOSE, CAPILLARY - Abnormal; Notable for the following:    Glucose-Capillary 245 (*)    All other components within normal limits  GLUCOSE, CAPILLARY - Abnormal; Notable for the following:    Glucose-Capillary 304 (*)    All other components within normal limits  GLUCOSE, CAPILLARY - Abnormal; Notable for the following:    Glucose-Capillary 254 (*)    All other components within normal limits  LIPID PANEL - Abnormal; Notable for the following:    Triglycerides 280 (*)    HDL 33 (*)    VLDL 56 (*)    All other components within normal limits  BASIC METABOLIC PANEL - Abnormal; Notable for the following:    Sodium 134 (*)    CO2 21 (*)    Glucose, Bld 279 (*)    BUN 34 (*)    Creatinine, Ser 1.75 (*)    GFR calc non Af Amer 41 (*)    GFR calc Af Amer 48 (*)    All other components within normal limits  CBC - Abnormal; Notable for the following:    WBC 11.9 (*)    RBC 3.83 (*)    Hemoglobin 12.6 (*)    HCT 36.8 (*)    All other components within normal limits  GLUCOSE, CAPILLARY - Abnormal; Notable for the following:    Glucose-Capillary 213 (*)    All other components within normal limits  GLUCOSE, CAPILLARY - Abnormal; Notable for the  following:    Glucose-Capillary 276 (*)    All other components within normal limits  GLUCOSE, CAPILLARY - Abnormal; Notable for the following:    Glucose-Capillary 211 (*)    All other components within normal limits  TROPONIN I  TROPONIN I  TROPONIN I  TROPONIN I     ____________________________________________   EKG  ED ECG REPORT I,  N Meilah Delrosario, the attending physician, personally viewed and interpreted this ECG.   Date: 04/01/2016  EKG Time: 3:03 AM  Rate: 73  Rhythm: Normal sinus rhythm  Axis: Normal  Intervals: Normal  ST&T Change: None   ____________________________________________    RADIOLOGY CT Chest Wo Contrast (Final result) Result time: 03/18/16 16:18:38   Final result by Rad Results In Interface (03/18/16 16:18:38)   Narrative:   CLINICAL DATA: Chest pain for 1 day. History of CHF.  EXAM: CT CHEST WITHOUT CONTRAST  TECHNIQUE: Multidetector CT imaging of the chest was performed following the standard protocol without IV contrast.  COMPARISON: 03/18/2016  FINDINGS: Neck base and axilla: No masses or enlarged lymph nodes. Visualized thyroid is unremarkable.  Mediastinum and hila: Heart normal in size and configuration. Small focus of left coronary artery calcifications. Great vessels are normal in caliber. No mediastinal or hilar masses or pathologically enlarged lymph nodes.  Lungs and pleura: In the right middle lobe adjacent to the minor fissure there is a 3.8 x 2.0 mm nodule (2.9 mm mean diameter) on image 66 of series 3. No other nodules. No masses. Minor dependent subsegmental atelectasis.  No evidence of pneumonia or edema. No pleural effusion or pneumothorax.  Limited upper abdomen: Fatty infiltration of liver. Status post cholecystectomy. Nonobstructing right intrarenal stone. Left renal cortical thinning. Small nonobstructing stone in the upper pole the left kidney. Status post spleenectomy.  Musculoskeletal:  Unremarkable.  IMPRESSION: 1. No acute findings. No evidence of congestive heart failure or pneumonia. 2. 2.9 mm right middle lobe pulmonary nodule. No follow-up needed if patient is low-risk. Non-contrast chest CT can be considered in 12 months if patient is high-risk. This recommendation follows the consensus statement: Guidelines for Management of Incidental Pulmonary Nodules Detected on CT Images:From the Fleischner Society 2017; published online before print (10.1148/radiol.IJ:2314499). 3. Hepatic steatosis.   Electronically Signed By: Lajean Manes M.D. On: 03/18/2016 16:18          DG Chest Port 1 View (Final result) Result time: 03/18/16 03:52:02   Final result by Rad Results In Interface (03/18/16 03:52:02)   Narrative:   CLINICAL DATA: Acute onset of left-sided chest pain. Initial encounter.  EXAM: PORTABLE CHEST 1 VIEW  COMPARISON: Chest radiograph from 02/17/2013  FINDINGS: The lungs are well-aerated and clear. There is no evidence of focal opacification, pleural effusion or pneumothorax.  The cardiomediastinal silhouette is borderline normal in size. No acute osseous abnormalities are seen. Clips are noted overlying the left upper quadrant.  IMPRESSION: No acute cardiopulmonary process seen.   Electronically Signed By: Garald Balding M.D. On: 03/18/2016 03:52         INITIAL IMPRESSION / ASSESSMENT AND PLAN / ED COURSE  Pertinent labs & imaging results that were available during my care of the patient were reviewed by me and considered in my medical decision making (see chart for details).  Patient received one sublingual nitroglycerin after arrival to the emergency department for nitroglycerin administration patient's blood pressure is 152/66 following the nitroglycerin his pressure dropped to 69/52. Patient with ongoing chest pain at this time and as such patient will be admitted to the hospital for further evaluation and  management.  ____________________________________________   FINAL CLINICAL IMPRESSION(S) / ED DIAGNOSES  Final diagnoses:  Angina at rest [facility])  Chest pain  Type 2 diabetes mellitus without complication, with long-term current use of insulin ([place])      Gregor Hams, MD 04/01/16 [phone]

## 2016-06-09 ENCOUNTER — Other Ambulatory Visit: Payer: Self-pay | Admitting: Physician Assistant

## 2016-06-28 ENCOUNTER — Other Ambulatory Visit: Payer: Self-pay | Admitting: Physician Assistant

## 2016-10-06 ENCOUNTER — Ambulatory Visit: Payer: Self-pay | Admitting: Physician Assistant

## 2016-10-06 DIAGNOSIS — Z299 Encounter for prophylactic measures, unspecified: Secondary | ICD-10-CM

## 2016-10-06 NOTE — Progress Notes (Signed)
Patient came in to get his scheduled flu vaccine. 

## 2016-10-18 ENCOUNTER — Ambulatory Visit: Payer: Self-pay | Admitting: Physician Assistant

## 2016-10-18 ENCOUNTER — Encounter: Payer: Self-pay | Admitting: Physician Assistant

## 2016-10-18 VITALS — BP 110/60 | HR 90 | Temp 98.7°F

## 2016-10-18 DIAGNOSIS — J0120 Acute ethmoidal sinusitis, unspecified: Secondary | ICD-10-CM

## 2016-10-18 NOTE — Progress Notes (Signed)
   Subjective:Sinus congestion    Patient ID: Stephen Harmon, male    DOB: 1961/01/13, 55 y.o.   MRN: [id number]  HPI Patient c/o one week of sinus congestion, muffle sound right ear, and non-productive cough. Denies fever/chill, or N/V/D. States seasonal rhinits. No palliative measures for this compliant.   Review of Systems    Diabetes, Hypertension, Hyperlipidema, and Hypothyroidism. Objective:   Physical Exam Right maxillary guarding with edematous right TM. Bilateral edematous nasal turbinates.  Neck supple w/o adenopathy. Lungs CTA, and Heart RRR.       Assessment & Plan:right maxillary sinusitis  Amoxil, Allergra-D, and Bactrim DS. Follow up PCP.

## 2016-11-10 ENCOUNTER — Encounter: Payer: Self-pay | Admitting: Physician Assistant

## 2016-11-10 ENCOUNTER — Ambulatory Visit: Payer: Self-pay | Admitting: Physician Assistant

## 2016-11-10 VITALS — BP 120/70 | HR 84 | Temp 98.4°F

## 2016-11-10 DIAGNOSIS — J069 Acute upper respiratory infection, unspecified: Secondary | ICD-10-CM

## 2016-11-10 MED ORDER — LEVOFLOXACIN 500 MG PO TABS: 500.0000 mg | ORAL_TABLET | Freq: Every day | ORAL | 0 refills | Status: DC

## 2016-11-10 NOTE — Progress Notes (Signed)
S: C/o runny nose and congestion for 2 weeks, no fever, chills, cp/sob, v/d; mucus was green this am but clear throughout the day, cough is sporadic, no swelling in feet  Using otc meds: robitussin  O: PE: vitals wnl, nad, perrl eomi, normocephalic, tms dull, nasal mucosa red and swollen, throat injected, neck supple no lymph, lungs c t a, cv rrr, neuro intact  A:  Acute uri   P: drink fluids, continue regular meds , use otc meds of choice, return if not improving in 5 days, return earlier if worsening. levaquin 500mg  qd, tussionex 150nr, unable to put tussionex in med list due to epic not working well

## 2016-12-10 ENCOUNTER — Ambulatory Visit: Payer: Self-pay | Admitting: Physician Assistant

## 2016-12-10 VITALS — BP 130/80 | HR 91 | Temp 98.2°F

## 2016-12-10 DIAGNOSIS — H6503 Acute serous otitis media, bilateral: Secondary | ICD-10-CM

## 2016-12-10 MED ORDER — PSEUDOEPHEDRINE HCL ER 120 MG PO TB12: 120.0000 mg | ORAL_TABLET | Freq: Two times a day (BID) | ORAL | 0 refills | Status: DC

## 2016-12-10 MED ORDER — LEVOFLOXACIN 500 MG PO TABS: 500.0000 mg | ORAL_TABLET | Freq: Every day | ORAL | 0 refills | Status: DC

## 2016-12-10 NOTE — Progress Notes (Signed)
   Subjective:Ear infection    Patient ID: Stephen Harmon, male    DOB: 08/25/1961, 56 y.o.   MRN: [id number]  HPI Patient c/o bilateral ear pressure. Patient finished course of antibiotic last week for same compliant. States medication was for sinus infection. States sinus pressure has resolved but ear pressure has increased. States mild hearing loss. No palliative measures for compliant.   Review of Systems Diabetes and hypertension.    Objective:   Physical Exam Bilateral edematous and erythematous TM. Rest of HEENT unremarkable. Neck supple, Lungs CTA, and Heart RRR.       Assessment & Plan:Otitis media  Sudafed and Levaquin as directed. Follow up one week if no improvement.

## 2016-12-15 ENCOUNTER — Other Ambulatory Visit: Payer: Self-pay | Admitting: Emergency Medicine

## 2016-12-15 MED ORDER — ALLOPURINOL 100 MG PO TABS: ORAL_TABLET | ORAL | 5 refills | Status: DC

## 2016-12-15 NOTE — Telephone Encounter (Signed)
Med refill for allopurinol approved 

## 2017-04-22 ENCOUNTER — Ambulatory Visit: Payer: Self-pay | Admitting: Physician Assistant

## 2017-04-22 ENCOUNTER — Encounter: Payer: Self-pay | Admitting: Physician Assistant

## 2017-04-22 VITALS — BP 110/68 | HR 76 | Temp 98.3°F

## 2017-04-22 DIAGNOSIS — R059 Cough, unspecified: Secondary | ICD-10-CM

## 2017-04-22 DIAGNOSIS — R05 Cough: Secondary | ICD-10-CM

## 2017-04-22 NOTE — Progress Notes (Signed)
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

## 2017-06-09 ENCOUNTER — Other Ambulatory Visit: Payer: Self-pay | Admitting: Physician Assistant

## 2017-06-09 NOTE — Telephone Encounter (Signed)
Med refill for allopurinol approved

## 2017-11-24 ENCOUNTER — Ambulatory Visit: Payer: Self-pay | Admitting: Physician Assistant

## 2017-11-24 ENCOUNTER — Encounter: Payer: Self-pay | Admitting: Physician Assistant

## 2017-11-24 VITALS — BP 130/60 | HR 71 | Temp 98.5°F | Resp 16

## 2017-11-24 DIAGNOSIS — K580 Irritable bowel syndrome with diarrhea: Secondary | ICD-10-CM

## 2017-11-24 MED ORDER — DICYCLOMINE HCL 20 MG PO TABS: 20.0000 mg | ORAL_TABLET | Freq: Four times a day (QID) | ORAL | 0 refills | Status: DC

## 2017-11-24 NOTE — Progress Notes (Signed)
   Subjective: Abdominal pain, diarrhea, and cramping     Patient ID: Stephen Harmon, male    DOB: Sep 29, 1961, 57 y.o.   MRN: [id number]  HPI Patient complain of over 3 months intermittent abdominal pain in the left lower quadrant, diarrhea, and cramping. Patient denies fever or bloody stools. Patient denies nausea or vomiting. Patient state diarrhea is moderately controlled with over-the-counter Imodium.   Review of Systems    diabetes, hypothyroidism, and hypertension. Objective:   Physical Exam No acute distress. No abdominal distention. Hyperactive bowel sounds. No guarding with palpation in all 4 quadrants.       Assessment & Plan: IBS   Patient given discharge care instructions and a prescription for Bentyl. Patient advised follow 1 week for reevaluation.

## 2017-11-30 ENCOUNTER — Ambulatory Visit: Payer: Self-pay | Admitting: Registered Nurse

## 2017-11-30 VITALS — BP 110/70 | HR 73 | Temp 98.5°F | Resp 16

## 2017-11-30 DIAGNOSIS — E1165 Type 2 diabetes mellitus with hyperglycemia: Secondary | ICD-10-CM

## 2017-11-30 DIAGNOSIS — K580 Irritable bowel syndrome with diarrhea: Secondary | ICD-10-CM

## 2017-11-30 DIAGNOSIS — IMO0002 Reserved for concepts with insufficient information to code with codable children: Secondary | ICD-10-CM

## 2017-11-30 DIAGNOSIS — E1129 Type 2 diabetes mellitus with other diabetic kidney complication: Secondary | ICD-10-CM

## 2017-11-30 DIAGNOSIS — Z23 Encounter for immunization: Secondary | ICD-10-CM

## 2017-11-30 MED ORDER — DICYCLOMINE HCL 20 MG PO TABS: 20.0000 mg | ORAL_TABLET | Freq: Four times a day (QID) | ORAL | 0 refills | Status: DC

## 2017-11-30 NOTE — Progress Notes (Signed)
Subjective:    Patient ID: Stephen Harmon, male    DOB: [birth year], 59 y.o.   MRN: [id number]  56y/o caucasian male established retired diabetic with CKD on insulin pump saw PA Tamala Julian 11/24/2017 for diarrhea x 1 month/cramping started on bentyl feeling better.  Yesterday had 2 BMs light brown soft and took bentyl x 3 doses skipped bedtime dose; prior to bentyl stools watery mixed with cramping that started 30-40 minutes after eating typically; yesterday abdomen cramping starting during golfing when he had not eaten.  Blood sugar low yesterday due to exercise decreased his bolus rate 50% as blood sugar 53 during golf at dinner 63 and this morning 102.  Insulin basal rate and metformin dose changes at Thanksgiving due to hypoglycemia at night 40s and 50s drinking orange juice to raise blood sugars multiple times per day; am metformin dropped and basal rate decreased patient unsure of dose.  States he has lost 28 lbs since Thanksgiving and diabetes medication changes.  Epic care everywhere notes reviewed endocrinology note 2130-0630 now 0.5 units U -500 then increase to 0.7 units per hours 0630-2130.  Last HgbA1c 7.2 09/26/2017  Blood sugar target 110 had been getting 41 units per day basal 38% bolus 62%.  Refused screening colonoscopy in the past has not had flu shot this year.  Denied bloody black or red stools, cramping has decreased since starting bentyl taking 2-3 doses per day.  Dairy once a day otherwise no spicy/fried.  Usually grilled meat.  Drinking orange juice usually daily for low blood sugars.  Increased flatulence, bloating has improved since starting bentyl.  PMHx Hodgkins lymphoma 1992/1997 last oncology appts 15 years ago, last diabetic eye exam 2018, obesity BMI 34, year round allergies, diabetes, chronic kidney disease, CHF  PSHx bilateral inguinal hernia repair 1960s, cholecystectomy 2004 (denied diarrhea afterwards), stem cell transplant 1997, splenectomy 1992      Review of Systems   Constitutional: Positive for unexpected weight change. Negative for activity change, appetite change, chills, diaphoresis, fatigue and fever.  HENT: Positive for congestion, postnasal drip and rhinorrhea. Negative for dental problem, drooling, ear discharge, ear pain, facial swelling, hearing loss, mouth sores, nosebleeds, sinus pressure, sinus pain, sneezing, sore throat, tinnitus, trouble swallowing and voice change.   Eyes: Negative for photophobia, pain, discharge, redness, itching and visual disturbance.  Respiratory: Negative for cough, choking, chest tightness, shortness of breath and wheezing.   Cardiovascular: Negative for chest pain, palpitations and leg swelling.  Gastrointestinal: Positive for abdominal pain and diarrhea. Negative for abdominal distention, anal bleeding, blood in stool, constipation, nausea, rectal pain and vomiting.  Endocrine: Negative for cold intolerance and heat intolerance.  Genitourinary: Negative for decreased urine volume, difficulty urinating, dysuria, enuresis, flank pain, frequency, hematuria and urgency.  Musculoskeletal: Negative for arthralgias, back pain, gait problem, joint swelling, myalgias, neck pain and neck stiffness.  Skin: Negative for color change, pallor, rash and wound.  Allergic/Immunologic: Positive for environmental allergies and immunocompromised state. Negative for food allergies.  Neurological: Negative for dizziness, tremors, seizures, syncope, facial asymmetry, speech difficulty, weakness, light-headedness, numbness and headaches.  Hematological: Negative for adenopathy. Does not bruise/bleed easily.  Psychiatric/Behavioral: Negative for agitation, confusion and sleep disturbance.       Objective:   Physical Exam  Constitutional: He is oriented to person, place, and time. Vital signs are normal. He appears well-developed and well-nourished. He is active and cooperative.  Non-toxic appearance. He does not have a sickly appearance. He  does not appear ill. No distress.  HENT:  Head: Normocephalic and atraumatic.  Right Ear: Hearing and external ear normal. A middle ear effusion is present.  Left Ear: Hearing and external ear normal. A middle ear effusion is present.  Nose: Nose normal. No mucosal edema, rhinorrhea, nose lacerations, sinus tenderness, nasal deformity, septal deviation or nasal septal hematoma. No epistaxis.  No foreign bodies. Right sinus exhibits no maxillary sinus tenderness and no frontal sinus tenderness. Left sinus exhibits no maxillary sinus tenderness and no frontal sinus tenderness.  Mouth/Throat: Uvula is midline and mucous membranes are normal. Mucous membranes are not pale, not dry and not cyanotic. He does not have dentures. No oral lesions. No trismus in the jaw. Normal dentition. No dental abscesses, uvula swelling, lacerations or dental caries. Posterior oropharyngeal edema and posterior oropharyngeal erythema present. No oropharyngeal exudate or tonsillar abscesses.  Cobblestoning posterior pharynx; bilateral TMs air fluid level 75% opacity; bilateral allergic shiners; bilateral nasal turbinates edema/erythema clear scant discharge  Eyes: Conjunctivae, EOM and lids are normal. Pupils are equal, round, and reactive to light. Right eye exhibits no discharge. Left eye exhibits no discharge. No scleral icterus.  Wearing glasses  Neck: Trachea normal, normal range of motion and phonation normal. Neck supple. No tracheal tenderness and no muscular tenderness present. No neck rigidity. No tracheal deviation, no edema, no erythema and normal range of motion present.  Cardiovascular: Normal rate, regular rhythm, S1 normal, S2 normal, normal heart sounds and intact distal pulses. Exam reveals no gallop, no distant heart sounds and no friction rub.  No murmur heard. Pulmonary/Chest: Effort normal and breath sounds normal. No accessory muscle usage or stridor. No respiratory distress. He has no decreased breath  sounds. He has no wheezes. He has no rhonchi. He has no rales. He exhibits no tenderness.  Spoke full sentences without difficulty; no cough observed in exam room  Abdominal: Soft. Normal appearance and bowel sounds are normal. He exhibits no shifting dullness, no distension, no pulsatile liver, no fluid wave, no abdominal bruit, no ascites, no pulsatile midline mass and no mass. There is no hepatosplenomegaly. There is no tenderness. There is no rigidity, no rebound, no guarding, no CVA tenderness, no tenderness at McBurney's point and negative Murphy's sign. Hernia confirmed negative in the ventral area.  Dull to percussion x4 quads; hyperactive bowel sounds x 4 quads  Musculoskeletal: Normal range of motion. He exhibits no edema, tenderness or deformity.       Right shoulder: Normal.       Left shoulder: Normal.       Right elbow: Normal.      Left elbow: Normal.       Right hip: Normal.       Left hip: Normal.       Right knee: Normal.       Left knee: Normal.       Right ankle: Normal.       Left ankle: Normal.       Cervical back: Normal.       Thoracic back: Normal.       Lumbar back: Normal.       Right hand: Normal.       Left hand: Normal.  Lymphadenopathy:       Head (right side): No submental, no submandibular, no tonsillar, no preauricular, no posterior auricular and no occipital adenopathy present.       Head (left side): No submental, no submandibular, no tonsillar, no preauricular, no posterior auricular and no occipital adenopathy present.  He has no cervical adenopathy.       Right cervical: No superficial cervical, no deep cervical and no posterior cervical adenopathy present.      Left cervical: No superficial cervical, no deep cervical and no posterior cervical adenopathy present.  Neurological: He is alert and oriented to person, place, and time. He has normal strength. He is not disoriented. He displays no atrophy and no tremor. No cranial nerve deficit or sensory  deficit. He exhibits normal muscle tone. He displays no seizure activity. Coordination and gait normal. GCS eye subscore is 4. GCS verbal subscore is 5. GCS motor subscore is 6.  On/off exam table without difficulty; normal heel toe gait in hallway  Skin: Skin is warm, dry and intact. No abrasion, no bruising, no burn, no ecchymosis, no laceration, no lesion, no petechiae, no purpura and no rash noted. Rash is not macular, not papular, not maculopapular, not nodular, not pustular, not vesicular and not urticarial. He is not diaphoretic. No cyanosis or erythema. No pallor. Nails show no clubbing.  Psychiatric: He has a normal mood and affect. His speech is normal and behavior is normal. Judgment and thought content normal. Cognition and memory are normal.          Assessment & Plan:  A-irritable bowel syndrome with diarrhea, diabetes mellitus on insulin with hypoglycemia, prophylactic vaccination influenza  P-Refilled bentyl 20mg  po q6h prn diarrhea/cramping #40 RF0 electronic Rx to his pharmacy of choice.  Discussed bland diet.  Better control of blood sugars--low and then having to drink orange juice could be exacerbating diarrhea.  Avoid dehydration due to CKD at risk due to loose stools.  Discussed signs and symptoms of lactose intolerance, diverticulitis, bowel obstruction with patient.  UC/Irritable bowel as patient increased risk to Hodgkins, diabetes and hypothyroidism.  Recommended routine colon cancer screening schedule with GI patient refused.  Discussed if these symptoms continue despite better control of diabetes and no further medication changes could be symptoms of GI disease/cancer when bowel movement consistency/frequency/size/color change and weight loss unintentional.  Seek immediate follow up if vomiting/bloody red or black bowel movements, worsening abdomen pain.  Exitcare handout on foods to relieve diarrhea, irritable bowel, hypoglycemia.  Patient verbalized understanding  information/instructions, agreed with plan of care and had no further questions at this time.  Next diabetes labs scheduled for 21 Dec 2017 patient to keep scheduled appt with endocrinology.  Continue 5 times per day blood sugar checks and insulin instructions per endocrinology.  Regular meals protein with fruits/vegetables/fiber.  Patient verbalized understanding information/instructions, agreed with plan of care and had no further questions at this time.  Flu vaccination today.  Discussed flu activity type A and H1N1 increasing in state.  Use hand sanitizer, frequent handwashing, avoid touching face or having others cough on him to avoid contracting flu.  Patient verbalized understanding information/instructions, agreed with plan of care and had no further questions at this time.

## 2017-11-30 NOTE — Patient Instructions (Addendum)
Keep follow up 30 Jan for diabetes labs/follow up Continue bentyl by mouth every 6 hours as needed Bland diet see diet recommendations below for diarrhea Schedule colonoscopy if no improvement diarrhea with improved blood sugar control/overdue for routine screening and cancer history  Hypoglycemia Hypoglycemia occurs when the level of sugar (glucose) in the blood is too low. Glucose is a type of sugar that provides the body's main source of energy. Certain hormones (insulin and glucagon) control the level of glucose in the blood. Insulin lowers blood glucose, and glucagon increases blood glucose. Hypoglycemia can result from having too much insulin in the bloodstream, or from not eating enough food that contains glucose. Hypoglycemia can happen in people who do or do not have diabetes. It can develop quickly, and it can be a medical emergency. What are the causes? Hypoglycemia occurs most often in people who have diabetes. If you have diabetes, hypoglycemia may be caused by:  Diabetes medicine.  Not eating enough, or not eating often enough.  Increased physical activity.  Drinking alcohol, especially when you have not eaten recently.  If you do not have diabetes, hypoglycemia may be caused by:  A tumor in the pancreas. The pancreas is the organ that makes insulin.  Not eating enough, or not eating for long periods at a time (fasting).  Severe infection or illness that affects the liver, heart, or kidneys.  Certain medicines.  You may also have reactive hypoglycemia. This condition causes hypoglycemia within 4 hours of eating a meal. This may occur after having stomach surgery. Sometimes, the cause of reactive hypoglycemia is not known. What increases the risk? Hypoglycemia is more likely to develop in:  People who have diabetes and take medicines to lower blood glucose.  People who abuse alcohol.  People who have a severe illness.  What are the signs or symptoms? Hypoglycemia  may not cause any symptoms. If you have symptoms, they may include:  Hunger.  Anxiety.  Sweating and feeling clammy.  Confusion.  Dizziness or feeling light-headed.  Sleepiness.  Nausea.  Increased heart rate.  Headache.  Blurry vision.  Seizure.  Nightmares.  Tingling or numbness around the mouth, lips, or tongue.  A change in speech.  Decreased ability to concentrate.  A change in coordination.  Restless sleep.  Tremors or shakes.  Fainting.  Irritability.  How is this diagnosed? Hypoglycemia is diagnosed with a blood test to measure your blood glucose level. This blood test is done while you are having symptoms. Your health care provider may also do a physical exam and review your medical history. If you do not have diabetes, other tests may be done to find the cause of your hypoglycemia. How is this treated? This condition can often be treated by immediately eating or drinking something that contains glucose, such as:  3-4 sugar tablets (glucose pills).  Glucose gel, 15-gram tube.  Fruit juice, 4 oz (120 mL).  Regular soda (not diet soda), 4 oz (120 mL).  Low-fat milk, 4 oz (120 mL).  Several pieces of hard candy.  Sugar or honey, 1 Tbsp.  Treating Hypoglycemia If You Have Diabetes  If you are alert and able to swallow safely, follow the 15:15 rule:  Take 15 grams of a rapid-acting carbohydrate. Rapid-acting options include: ? 1 tube of glucose gel. ? 3 glucose pills. ? 6-8 pieces of hard candy. ? 4 oz (120 mL) of fruit juice. ? 4 oz (120 ml) of regular (not diet) soda.  Check your blood glucose  15 minutes after you take the carbohydrate.  If the repeat blood glucose level is still at or below 70 mg/dL (3.9 mmol/L), take 15 grams of a carbohydrate again.  If your blood glucose level does not increase above 70 mg/dL (3.9 mmol/L) after 3 tries, seek emergency medical care.  After your blood glucose level returns to normal, eat a meal or  a snack within 1 hour.  Treating Severe Hypoglycemia Severe hypoglycemia is when your blood glucose level is at or below 54 mg/dL (3 mmol/L). Severe hypoglycemia is an emergency. Do not wait to see if the symptoms will go away. Get medical help right away. Call your local emergency services (911 in the [place] Do not drive yourself to the hospital. If you have severe hypoglycemia and you cannot eat or drink, you may need an injection of glucagon. A family member or close friend should learn how to check your blood glucose and how to give you a glucagon injection. Ask your health care provider if you need to have an emergency glucagon injection kit available. Severe hypoglycemia may need to be treated in a hospital. The treatment may include getting glucose through an IV tube. You may also need treatment for the cause of your hypoglycemia. Follow these instructions at home: General instructions  Avoid any diets that cause you to not eat enough food. Talk with your health care provider before you start any new diet.  Take over-the-counter and prescription medicines only as told by your health care provider.  Limit alcohol intake to no more than 1 drink per day for nonpregnant women and 2 drinks per day for men. One drink equals 12 oz of beer, 5 oz of wine, or 1 oz of hard liquor.  Keep all follow-up visits as told by your health care provider. This is important. If You Have Diabetes:   Make sure you know the symptoms of hypoglycemia.  Always have a rapid-acting carbohydrate snack with you to treat low blood sugar.  Follow your diabetes management plan, as told by your health care provider. Make sure you: ? Take your medicines as directed. ? Follow your exercise plan. ? Follow your meal plan. Eat on time, and do not skip meals. ? Check your blood glucose as often as directed. Make sure to check your blood glucose before and after exercise. If you exercise longer or in a different way than  usual, check your blood glucose more often. ? Follow your sick day plan whenever you cannot eat or drink normally. Make this plan in advance with your health care provider.  Share your diabetes management plan with people in your workplace, school, and household.  Check your urine for ketones when you are ill and as told by your health care provider.  Carry a medical alert card or wear medical alert jewelry. If You Have Reactive Hypoglycemia or Low Blood Sugar From Other Causes:  Monitor your blood glucose as told by your health care provider.  Follow instructions from your health care provider about eating or drinking restrictions. Contact a health care provider if:  You have problems keeping your blood glucose in your target range.  You have frequent episodes of hypoglycemia. Get help right away if:  You continue to have hypoglycemia symptoms after eating or drinking something containing glucose.  Your blood glucose is at or below 54 mg/dL (3 mmol/L).  You have a seizure.  You faint. These symptoms may represent a serious problem that is an emergency. Do not wait  to see if the symptoms will go away. Get medical help right away. Call your local emergency services (911 in the [place] Do not drive yourself to the hospital. This information is not intended to replace advice given to you by your health care provider. Make sure you discuss any questions you have with your health care provider. Document Released: 11/08/2005 Document Revised: 04/21/2016 Document Reviewed: 12/12/2015 Elsevier Interactive Patient Education  2018 [employer] Irritable Bowel Syndrome, Adult Irritable bowel syndrome (IBS) is not one specific disease. It is a group of symptoms that affects the organs responsible for digestion (gastrointestinal or GI tract). To regulate how your GI tract works, your body sends signals back and forth between your intestines and your brain. If you have IBS, there may be a problem  with these signals. As a result, your GI tract does not function normally. Your intestines may become more sensitive and overreact to certain things. This is especially true when you eat certain foods or when you are under stress. There are four types of IBS. These may be determined based on the consistency of your stool:  IBS with diarrhea.  IBS with constipation.  Mixed IBS.  Unsubtyped IBS.  It is important to know which type of IBS you have. Some treatments are more likely to be helpful for certain types of IBS. What are the causes? The exact cause of IBS is not known. What increases the risk? You may have a higher risk of IBS if:  You are a woman.  You are younger than 59 years old.  You have a family history of IBS.  You have mental health problems.  You have had bacterial infection of your GI tract.  What are the signs or symptoms? Symptoms of IBS vary from person to person. The main symptom is abdominal pain or discomfort. Additional symptoms usually include one or more of the following:  Diarrhea, constipation, or both.  Abdominal swelling or bloating.  Feeling full or sick after eating a small or regular-size meal.  Frequent gas.  Mucus in the stool.  A feeling of having more stool left after a bowel movement.  Symptoms tend to come and go. They may be associated with stress, psychiatric conditions, or nothing at all. How is this diagnosed? There is no specific test to diagnose IBS. Your health care provider will make a diagnosis based on a physical exam, medical history, and your symptoms. You may have other tests to rule out other conditions that may be causing your symptoms. These may include:  Blood tests.  X-rays.  CT scan.  Endoscopy and colonoscopy. This is a test in which your GI tract is viewed with a long, thin, flexible tube.  How is this treated? There is no cure for IBS, but treatment can help relieve symptoms. IBS treatment often  includes:  Changes to your diet, such as: ? Eating more fiber. ? Avoiding foods that cause symptoms. ? Drinking more water. ? Eating regular, medium-sized portioned meals.  Medicines. These may include: ? Fiber supplements if you have constipation. ? Medicine to control diarrhea (antidiarrheal medicines). ? Medicine to help control muscle spasms in your GI tract (antispasmodic medicines). ? Medicines to help with any mental health issues, such as antidepressants or tranquilizers.  Therapy. ? Talk therapy may help with anxiety, depression, or other mental health issues that can make IBS symptoms worse.  Stress reduction. ? Managing your stress can help keep symptoms under control.  Follow these instructions at home:  Take medicines only as directed by your health care provider.  Eat a healthy diet. ? Avoid foods and drinks with added sugar. ? Include more whole grains, fruits, and vegetables gradually into your diet. This may be especially helpful if you have IBS with constipation. ? Avoid any foods and drinks that make your symptoms worse. These may include dairy products and caffeinated or carbonated drinks. ? Do not eat large meals. ? Drink enough fluid to keep your urine clear or pale yellow.  Exercise regularly. Ask your health care provider for recommendations of good activities for you.  Keep all follow-up visits as directed by your health care provider. This is important. Contact a health care provider if:  You have constant pain.  You have trouble or pain with swallowing.  You have worsening diarrhea. Get help right away if:  You have severe and worsening abdominal pain.  You have diarrhea and: ? You have a rash, stiff neck, or severe headache. ? You are irritable, sleepy, or difficult to awaken. ? You are weak, dizzy, or extremely thirsty.  You have bright red blood in your stool or you have black tarry stools.  You have unusual abdominal swelling that is  painful.  You vomit continuously.  You vomit blood (hematemesis).  You have both abdominal pain and a fever. This information is not intended to replace advice given to you by your health care provider. Make sure you discuss any questions you have with your health care provider. Document Released: 11/08/2005 Document Revised: 04/09/2016 Document Reviewed: 07/26/2014 Elsevier Interactive Patient Education  2018 [place] Choices to Help Relieve Diarrhea, Adult When you have diarrhea, the foods you eat and your eating habits are very important. Choosing the right foods and drinks can help:  Relieve diarrhea.  Replace lost fluids and nutrients.  Prevent dehydration.  What general guidelines should I follow? Relieving diarrhea  Choose foods with less than 2 g or .07 oz. of fiber per serving.  Limit fats to less than 8 tsp (38 g or 1.34 oz.) a day.  Avoid the following: ? Foods and beverages sweetened with high-fructose corn syrup, honey, or sugar alcohols such as xylitol, sorbitol, and mannitol. ? Foods that contain a lot of fat or sugar. ? Fried, greasy, or spicy foods. ? High-fiber grains, breads, and cereals. ? Raw fruits and vegetables.  Eat foods that are rich in probiotics. These foods include dairy products such as yogurt and fermented milk products. They help increase healthy bacteria in the stomach and intestines (gastrointestinal tract, or GI tract).  If you have lactose intolerance, avoid dairy products. These may make your diarrhea worse.  Take medicine to help stop diarrhea (antidiarrheal medicine) only as told by your health care provider. Replacing nutrients  Eat small meals or snacks every 3-4 hours.  Eat bland foods, such as white rice, toast, or baked potato, until your diarrhea starts to get better. Gradually reintroduce nutrient-rich foods as tolerated or as told by your health care provider. This includes: ? Well-cooked protein foods. ? Peeled,  seeded, and soft-cooked fruits and vegetables. ? Low-fat dairy products.  Take vitamin and mineral supplements as told by your health care provider. Preventing dehydration   Start by sipping water or a special solution to prevent dehydration (oral rehydration solution, ORS). Urine that is clear or pale yellow means that you are getting enough fluid.  Try to drink at least 8-10 cups of fluid each day to help replace lost fluids.  You may  add other liquids in addition to water, such as clear juice or decaffeinated sports drinks, as tolerated or as told by your health care provider.  Avoid drinks with caffeine, such as coffee, tea, or soft drinks.  Avoid alcohol. What foods are recommended? The items listed may not be a complete list. Talk with your health care provider about what dietary choices are best for you. Grains White rice. White, [place], or pita breads (fresh or toasted), including plain rolls, buns, or bagels. White pasta. Saltine, soda, or graham crackers. Pretzels. Low-fiber cereal. Cooked cereals made with water (such as cornmeal, farina, or cream cereals). Plain muffins. Matzo. Melba toast. Zwieback. Vegetables Potatoes (without the skin). Most well-cooked and canned vegetables without skins or seeds. Tender lettuce. Fruits Apple sauce. Fruits canned in juice. Cooked apricots, cherries, grapefruit, peaches, pears, or plums. Fresh bananas and cantaloupe. Meats and other protein foods Baked or boiled chicken. Eggs. Tofu. Fish. Seafood. Smooth nut butters. Ground or well-cooked tender beef, ham, veal, lamb, pork, or poultry. Dairy Plain yogurt, kefir, and unsweetened liquid yogurt. Lactose-free milk, buttermilk, skim milk, or soy milk. Low-fat or nonfat hard cheese. Beverages Water. Low-calorie sports drinks. Fruit juices without pulp. Strained tomato and vegetable juices. Decaffeinated teas. Sugar-free beverages not sweetened with sugar alcohols. Oral rehydration solutions, if  approved by your health care provider. Seasoning and other foods Bouillon, broth, or soups made from recommended foods. What foods are not recommended? The items listed may not be a complete list. Talk with your health care provider about what dietary choices are best for you. Grains Whole grain, whole wheat, bran, or rye breads, rolls, pastas, and crackers. Wild or brown rice. Whole grain or bran cereals. Barley. Oats and oatmeal. Corn tortillas or taco shells. Granola. Popcorn. Vegetables Raw vegetables. Fried vegetables. Cabbage, broccoli, Brussels sprouts, artichokes, baked beans, beet greens, corn, kale, legumes, peas, sweet potatoes, and yams. Potato skins. Cooked spinach and cabbage. Fruits Dried fruit, including raisins and dates. Raw fruits. Stewed or dried prunes. Canned fruits with syrup. Meat and other protein foods Fried or fatty meats. Deli meats. Chunky nut butters. Nuts and seeds. Beans and lentils. Berniece Salines. Hot dogs. Sausage. Dairy High-fat cheeses. Whole milk, chocolate milk, and beverages made with milk, such as milk shakes. Half-and-half. Cream. sour cream. Ice cream. Beverages Caffeinated beverages (such as coffee, tea, soda, or energy drinks). Alcoholic beverages. Fruit juices with pulp. Prune juice. Soft drinks sweetened with high-fructose corn syrup or sugar alcohols. High-calorie sports drinks. Fats and oils Butter. Cream sauces. Margarine. Salad oils. Plain salad dressings. Olives. Avocados. Mayonnaise. Sweets and desserts Sweet rolls, doughnuts, and sweet breads. Sugar-free desserts sweetened with sugar alcohols such as xylitol and sorbitol. Seasoning and other foods Honey. Hot sauce. Chili powder. Gravy. Cream-based or milk-based soups. Pancakes and waffles. Summary  When you have diarrhea, the foods you eat and your eating habits are very important.  Make sure you get at least 8-10 cups of fluid each day, or enough to keep your urine clear or pale yellow.  Eat  bland foods and gradually reintroduce healthy, nutrient-rich foods as tolerated, or as told by your health care provider.  Avoid high-fiber, fried, greasy, or spicy foods. This information is not intended to replace advice given to you by your health care provider. Make sure you discuss any questions you have with your health care provider. Document Released: 01/29/2004 Document Revised: 11/05/2016 Document Reviewed: 11/05/2016 Elsevier Interactive Patient Education  2018 [place]. Influenza (Flu) Vaccine (Inactivated or Recombinant): What You  Need to Know 1. Why get vaccinated? Influenza ("flu") is a contagious disease that spreads around the [place] every year, usually between October and May. Flu is caused by influenza viruses, and is spread mainly by coughing, sneezing, and close contact. Anyone can get flu. Flu strikes suddenly and can last several days. Symptoms vary by age, but can include:  fever/chills  sore throat  muscle aches  fatigue  cough  headache  runny or stuffy nose  Flu can also lead to pneumonia and blood infections, and cause diarrhea and seizures in children. If you have a medical condition, such as heart or lung disease, flu can make it worse. Flu is more dangerous for some people. Infants and young children, people 45 years of age and older, pregnant women, and people with certain health conditions or a weakened immune system are at greatest risk. Each year thousands of people in the [place] die from flu, and many more are hospitalized. Flu vaccine can:  keep you from getting flu,  make flu less severe if you do get it, and  keep you from spreading flu to your family and other people. 2. Inactivated and recombinant flu vaccines A dose of flu vaccine is recommended every flu season. Children 6 months through 43 years of age may need two doses during the same flu season. Everyone else needs only one dose each flu season. Some inactivated  flu vaccines contain a very small amount of a mercury-based preservative called thimerosal. Studies have not shown thimerosal in vaccines to be harmful, but flu vaccines that do not contain thimerosal are available. There is no live flu virus in flu shots. They cannot cause the flu. There are many flu viruses, and they are always changing. Each year a new flu vaccine is made to protect against three or four viruses that are likely to cause disease in the upcoming flu season. But even when the vaccine doesn't exactly match these viruses, it may still provide some protection. Flu vaccine cannot prevent:  flu that is caused by a virus not covered by the vaccine, or  illnesses that look like flu but are not.  It takes about 2 weeks for protection to develop after vaccination, and protection lasts through the flu season. 3. Some people should not get this vaccine Tell the person who is giving you the vaccine:  If you have any severe, life-threatening allergies. If you ever had a life-threatening allergic reaction after a dose of flu vaccine, or have a severe allergy to any part of this vaccine, you may be advised not to get vaccinated. Most, but not all, types of flu vaccine contain a small amount of egg protein.  If you ever had Guillain-Barr Syndrome (also called GBS). Some people with a history of GBS should not get this vaccine. This should be discussed with your doctor.  If you are not feeling well. It is usually okay to get flu vaccine when you have a mild illness, but you might be asked to come back when you feel better.  4. Risks of a vaccine reaction With any medicine, including vaccines, there is a chance of reactions. These are usually mild and go away on their own, but serious reactions are also possible. Most people who get a flu shot do not have any problems with it. Minor problems following a flu shot include:  soreness, redness, or swelling where the shot was  given  hoarseness  sore, red or itchy eyes  cough  fever  aches  headache  itching  fatigue  If these problems occur, they usually begin soon after the shot and last 1 or 2 days. More serious problems following a flu shot can include the following:  There may be a small increased risk of Guillain-Barre Syndrome (GBS) after inactivated flu vaccine. This risk has been estimated at 1 or 2 additional cases per million people vaccinated. This is much lower than the risk of severe complications from flu, which can be prevented by flu vaccine.  Young children who get the flu shot along with pneumococcal vaccine (PCV13) and/or DTaP vaccine at the same time might be slightly more likely to have a seizure caused by fever. Ask your doctor for more information. Tell your doctor if a child who is getting flu vaccine has ever had a seizure.  Problems that could happen after any injected vaccine:  People sometimes faint after a medical procedure, including vaccination. Sitting or lying down for about 15 minutes can help prevent fainting, and injuries caused by a fall. Tell your doctor if you feel dizzy, or have vision changes or ringing in the ears.  Some people get severe pain in the shoulder and have difficulty moving the arm where a shot was given. This happens very rarely.  Any medication can cause a severe allergic reaction. Such reactions from a vaccine are very rare, estimated at about 1 in a million doses, and would happen within a few minutes to a few hours after the vaccination. As with any medicine, there is a very remote chance of a vaccine causing a serious injury or death. The safety of vaccines is always being monitored. For more information, visit: http://www.aguilar.org/ 5. What if there is a serious reaction? What should I look for? Look for anything that concerns you, such as signs of a severe allergic reaction, very high fever, or unusual behavior. Signs of a severe  allergic reaction can include hives, swelling of the face and throat, difficulty breathing, a fast heartbeat, dizziness, and weakness. These would start a few minutes to a few hours after the vaccination. What should I do?  If you think it is a severe allergic reaction or other emergency that can't wait, call 9-1-1 and get the person to the nearest hospital. Otherwise, call your doctor.  Reactions should be reported to the Vaccine Adverse Event Reporting System (VAERS). Your doctor should file this report, or you can do it yourself through the VAERS web site at www.vaers.SamedayNews.es, or by calling 681-239-7299. ? VAERS does not give medical advice. 6. The National Vaccine Injury Compensation Program The [employer] Vaccine Injury Compensation Program (VICP) is a federal program that was created to compensate people who may have been injured by certain vaccines. Persons who believe they may have been injured by a vaccine can learn about the program and about filing a claim by calling 8160856834 or visiting the [place] website at GoldCloset.com.ee. There is a time limit to file a claim for compensation. 7. How can I learn more?  Ask your healthcare provider. He or she can give you the vaccine package insert or suggest other sources of information.  Call your local or state health department.  Contact the Centers for Disease Control and Prevention (CDC): ? Call 7874328872 (1-800-CDC-INFO) or ? Visit CDC's website at https://gibson.com/ Vaccine Information Statement, Inactivated Influenza Vaccine (06/28/2014) This information is not intended to replace advice given to you by your health care provider. Make sure you discuss any questions you have with your  health care provider. Document Released: 09/02/2006 Document Revised: 07/29/2016 Document Reviewed: 07/29/2016 Elsevier Interactive Patient Education  2017 [employer].

## 2017-12-28 ENCOUNTER — Ambulatory Visit: Payer: Self-pay | Admitting: Emergency Medicine

## 2017-12-28 VITALS — BP 120/69 | HR 71 | Temp 98.5°F | Resp 16

## 2017-12-28 DIAGNOSIS — R634 Abnormal weight loss: Secondary | ICD-10-CM

## 2017-12-28 DIAGNOSIS — R1010 Upper abdominal pain, unspecified: Secondary | ICD-10-CM

## 2017-12-28 MED ORDER — DICYCLOMINE HCL 20 MG PO TABS: 20.0000 mg | ORAL_TABLET | Freq: Four times a day (QID) | ORAL | 0 refills | Status: DC

## 2017-12-28 NOTE — Progress Notes (Signed)
Subjective. Patient states that since Thanksgiving he has had intermittent episodes of cramping following eating. He frequently has to go immediately to the bathroom where he has a loose stool. He was seen and started on Bentyl. The Bentyl does seem to help. He saw his endocrinologist recently. He is on an insulin pump. Of note he has had 2 episodes of Hodgkin's lymphoma. The first was treated with chemotherapy in his 19s. 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 He has lost 30 pounds since Thanksgiving Objective. Chest clear to auscultation. Heart regular rate and rhythm no gallop. Abdomen flat normal bowel sounds no masses. Assessment. Anything is possible to be giving him his symptoms. He needs to have a colonoscopy and CT of his abdomen. Certainly his GI symptoms may be related to his diabetes with autonomic dysfunction . He has had some improvement with Bentyl but that is just symptomatic treatment which he cannot stay on. Plan. GI referral. Decrease Glucophage to 1 pill a day. Bentyl refill. We had a long discussion regarding his past medical history and hopefully he will proceed with evaluation. His 30 pound weight loss is very worrisome. By history the weight loss is 30 pounds. His reason for not having a colonoscopy and pursuing evaluation is because of what he went through with treatment of his lymphoma.

## 2017-12-28 NOTE — Patient Instructions (Signed)
I have refilled your Bentyl. Please decrease your Glucophage to 1 a day. Please see the GI specialist. Please establish with a primary care physician.

## 2017-12-28 NOTE — Addendum Note (Signed)
Addended by: Rudene Anda T on: 12/28/2017 02:43 PM   Modules accepted: Orders

## 2018-01-04 ENCOUNTER — Encounter: Payer: Self-pay | Admitting: Gastroenterology

## 2018-01-04 ENCOUNTER — Encounter (INDEPENDENT_AMBULATORY_CARE_PROVIDER_SITE_OTHER): Payer: Self-pay

## 2018-01-04 ENCOUNTER — Other Ambulatory Visit: Payer: Self-pay

## 2018-01-04 ENCOUNTER — Ambulatory Visit (INDEPENDENT_AMBULATORY_CARE_PROVIDER_SITE_OTHER): Payer: Medicare Other | Admitting: Gastroenterology

## 2018-01-04 VITALS — BP 158/78 | HR 67 | Temp 98.2°F | Ht 68.0 in | Wt 213.4 lb

## 2018-01-04 DIAGNOSIS — D649 Anemia, unspecified: Secondary | ICD-10-CM

## 2018-01-04 DIAGNOSIS — K529 Noninfective gastroenteritis and colitis, unspecified: Secondary | ICD-10-CM

## 2018-01-04 NOTE — Patient Instructions (Signed)
1. Continue bentyl as needed for cramps 2. Try IB gaurd 3. Stool studies, blood tests 4. Avoid splenda, processed meat, red meat 5. Recommend colonoscopy   Please call our office to speak with my nurse Orvilla Fus at [phone] during business hours from 8am to 4pm if you have any questions/concerns. During after hours, you will be redirected to on call GI physician. For any emergency please call 911 or go the nearest emergency room.    [name], MD [address]  [place]  [place], [place] [address]  Main: [phone]  Fax: [phone]

## 2018-01-04 NOTE — Progress Notes (Signed)
[name], MD [address]  [place]  [place], [place] [address]  Main: [phone]  Fax: [phone]    Gastroenterology Consultation  Referring Provider:     Versie Starks, PA-C Primary Care Physician:  Versie Starks, PA-C Primary Gastroenterologist:  Dr. [name] Reason for Consultation:     Diarrhea and abdominal pain        HPI:   ROSEMARY PENTECOST is a 57 y.o. male referred by Dr. Caryn Section, Linden Dolin, PA-C  for consultation & management of diarrhea and abdominal pain. He reports 3 months history of diffuse abdominal cramps, associated with nonbloody diarrhea, 4-6 times daily. The symptoms are mostly postprandial. He denies nocturnal diarrhea or incontinence. His weight has been stable overall, may have lost about 5 pounds within last 3 months. The symptoms have started around Thanksgiving. He is taking Bentyl as needed which helps with cramps. He consumes red meat regularly, uses Splenda in ice tea and coffee. He denies nausea, vomiting, blood in stools, fever, chills, night sweats. He has history of  Hodgkin's lymphoma and underwent stem cell transplant several years ago. He also has history of nonischemic cardiomyopathy and CHF. 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 Patient is accompanied by his wife today. He did not have any stool workup done. He denies smoking or alcohol He denies recent travel or sick contacts or using well water or antibiotic use or other viral illness  NSAIDs: none  Antiplts/Anticoagulants/Anti thrombotics: none  GI Procedures: none  Past Medical History:  Diagnosis Date  . Cancer ([place])    Hodgkin's Lymphoma in the past  . CHF (congestive heart failure) ([place])   . Diabetes mellitus without complication ([place])   . Hodgkin's lymphoma ([place])   . Hyperlipidemia   . Thyroid disease     Past Surgical History:  Procedure Laterality Date  . BONE  MARROW TRANSPLANT    . CHOLECYSTECTOMY    . EXPLORATORY LAPAROTOMY    . FOOT SURGERY Bilateral   . HERNIA REPAIR Bilateral    both hernia repair surgeries as a child  . SPLENECTOMY      Prior to Admission medications   Medication Sig Start Date End Date Taking? Authorizing Provider  allopurinol (ZYLOPRIM) 100 MG tablet TAKE ONE (1) TABLET BY MOUTH EVERY DAY 06/09/17  Yes Fisher, Linden Dolin, PA-C  aspirin 81 MG tablet Take 81 mg by mouth daily.   Yes [provider]  atorvastatin (LIPITOR) 10 MG tablet Take 10 mg by mouth daily.   Yes [provider]  carvedilol (COREG) 6.25 MG tablet Take 6.25 mg by mouth 2 (two) times daily with a meal.   Yes [provider]  dicyclomine (BENTYL) 20 MG tablet Take 1 tablet (20 mg total) by mouth every 6 (six) hours. 12/28/17  Yes Darlyne Russian, MD  furosemide (LASIX) 20 MG tablet Take 1 tablet (20 mg total) by mouth daily as needed for fluid or edema. 03/19/16  Yes Gladstone Lighter, MD  glucose blood test strip 1 each by Other route every 4 (four) hours. Use as instructed   Yes [provider]  insulin regular human CONCENTRATED (HUMULIN R) 500 UNIT/ML injection 325 Units by Subcutaneous Infusion route daily. 11/07/17  Yes [provider]  levothyroxine (SYNTHROID, LEVOTHROID) 25 MCG tablet Take 1 tablet (25 mcg total) by mouth daily before breakfast. 11/27/15  Yes Caryn Section Linden Dolin, PA-C  lisinopril (PRINIVIL,ZESTRIL) 2.5 MG tablet Take 2.5 mg by mouth daily.   Yes [provider]  metFORMIN (GLUCOPHAGE-XR) 500 MG 24 hr tablet Take 2 tablets by mouth daily before supper. 10/12/17  Yes [provider]  spironolactone (ALDACTONE) 25 MG tablet Take 25 mg by mouth daily.   Yes [provider]    Family History  Problem Relation Age of Onset  . Heart disease Father   . Diabetes Father      Social History   Tobacco Use  . Smoking status: Never Smoker  . Smokeless tobacco: Never Used    Substance Use Topics  . Alcohol use: Yes    Alcohol/week: 0.0 oz    Comment: wine occasionally  . Drug use: No    Allergies as of 01/04/2018 - Review Complete 01/04/2018  Allergen Reaction Noted  . Amoxicillin Hives 01/30/2016    Review of Systems:    All systems reviewed and negative except where noted in HPI.   Physical Exam:  BP (!) 158/78   Pulse 67   Temp 98.2 F (36.8 C) (Oral)   Ht 5\' 8"  (1.727 m)   Wt 213 lb 6.4 oz (96.8 kg)   BMI 32.45 kg/m  No LMP for male patient.  General:   Alert,  Well-developed, well-nourished, pleasant and cooperative in NAD Head:  Normocephalic and atraumatic. Eyes:  Sclera clear, no icterus.   Conjunctiva pink. Ears:  Normal auditory acuity. Nose:  No deformity, discharge, or lesions. Mouth:  No deformity or lesions,oropharynx pink & moist. Neck:  Supple; no masses or thyromegaly. Lungs:  Respirations even and unlabored.  Clear throughout to auscultation.   No wheezes, crackles, or rhonchi. No acute distress. Heart:  Regular rate and rhythm; no murmurs, clicks, rubs, or gallops. Abdomen:  Normal bowel sounds. Soft, non-tender and non-distended without masses, hepatosplenomegaly or hernias noted.  No guarding or rebound tenderness.   Rectal: Not performed Msk:  Symmetrical without gross deformities. Good, equal movement & strength bilaterally. Pulses:  Normal pulses noted. Extremities:  No clubbing or edema.  No cyanosis. Neurologic:  Alert and oriented x3;  grossly normal neurologically. Skin:  Intact without significant lesions or rashes. No jaundice. Lymph Nodes:  No significant cervical adenopathy. Psych:  Alert and cooperative. Normal mood and affect.  Imaging Studies: none  Assessment and Plan:   PERNELL LENOIR is a 57 y.o. male with history of Hodgkin's lymphoma status post stem cell transplant, nonischemic cardiomyopathy presents with 3 month history of abdominal cramps and nonbloody diarrhea, predominantly  postprandial. Differentials include inflammatory bowel disease or microscopic colitis or celiac disease or IBS or less likely malignancy or chronic nonocclusive ischemic colitis or infectious colitis  - check stool studies for C. Difficile and other GI pathogens - Check CRP, TTG IgA, CBC, ferritin, fecal CAL protectin - Recommend EGD and colonoscopy if stool studies are negative - Avoid red meat and artificial sweeteners - Continue Bentyl - trial of IB guard   Follow up in 4 weeks   [name], MD

## 2018-01-05 ENCOUNTER — Other Ambulatory Visit: Payer: Self-pay

## 2018-01-05 DIAGNOSIS — R197 Diarrhea, unspecified: Secondary | ICD-10-CM

## 2018-01-05 DIAGNOSIS — R109 Unspecified abdominal pain: Secondary | ICD-10-CM

## 2018-01-07 ENCOUNTER — Other Ambulatory Visit: Payer: Self-pay | Admitting: Gastroenterology

## 2018-01-07 ENCOUNTER — Encounter: Payer: Self-pay | Admitting: Gastroenterology

## 2018-01-09 NOTE — Discharge Instructions (Signed)
General Anesthesia, Adult, Care After °These instructions provide you with information about caring for yourself after your procedure. Your health care provider may also give you more specific instructions. Your treatment has been planned according to current medical practices, but problems sometimes occur. Call your health care provider if you have any problems or questions after your procedure. °What can I expect after the procedure? °After the procedure, it is common to have: °· Vomiting. °· A sore throat. °· Mental slowness. ° °It is common to feel: °· Nauseous. °· Cold or shivery. °· Sleepy. °· Tired. °· Sore or achy, even in parts of your body where you did not have surgery. ° °Follow these instructions at home: °For at least 24 hours after the procedure: °· Do not: °? Participate in activities where you could fall or become injured. °? Drive. °? Use heavy machinery. °? Drink alcohol. °? Take sleeping pills or medicines that cause drowsiness. °? Make important decisions or sign legal documents. °? Take care of children on your own. °· Rest. °Eating and drinking °· If you vomit, drink water, juice, or soup when you can drink without vomiting. °· Drink enough fluid to keep your urine clear or pale yellow. °· Make sure you have little or no nausea before eating solid foods. °· Follow the diet recommended by your health care provider. °General instructions °· Have a responsible adult stay with you until you are awake and alert. °· Return to your normal activities as told by your health care provider. Ask your health care provider what activities are safe for you. °· Take over-the-counter and prescription medicines only as told by your health care provider. °· If you smoke, do not smoke without supervision. °· Keep all follow-up visits as told by your health care provider. This is important. °Contact a health care provider if: °· You continue to have nausea or vomiting at home, and medicines are not helpful. °· You  cannot drink fluids or start eating again. °· You cannot urinate after 8-12 hours. °· You develop a skin rash. °· You have fever. °· You have increasing redness at the site of your procedure. °Get help right away if: °· You have difficulty breathing. °· You have chest pain. °· You have unexpected bleeding. °· You feel that you are having a life-threatening or urgent problem. °This information is not intended to replace advice given to you by your health care provider. Make sure you discuss any questions you have with your health care provider. °Document Released: 02/14/2001 Document Revised: 04/12/2016 Document Reviewed: 10/23/2015 °Elsevier Interactive Patient Education © 2018 Elsevier Inc. ° °

## 2018-01-10 NOTE — Anesthesia Preprocedure Evaluation (Addendum)
Anesthesia Evaluation  Patient identified by MRN, date of birth, ID band Patient awake    Reviewed: Allergy & Precautions, NPO status , Patient's Chart, lab work & pertinent test results  History of Anesthesia Complications Negative for: history of anesthetic complications  Airway Mallampati: IV  TM Distance: >3 FB Neck ROM: Full    Dental no notable dental hx.    Pulmonary neg pulmonary ROS,    Pulmonary exam normal breath sounds clear to auscultation       Cardiovascular Exercise Tolerance: Good +CHF (2/2 chemo)  Normal cardiovascular exam Rhythm:Regular Rate:Normal  Dilated CM  Myocardial perfusion 03/19/16:  There was no ST segment deviation noted during stress. Defect 1: There is a small defect of mild severity present in the mid inferior location. Findings consistent with prior myocardial infarction. This is a low risk study. The left ventricular ejection fraction is normal (55-65%).   Neuro/Psych negative neurological ROS     GI/Hepatic negative GI ROS,   Endo/Other  diabetes (insulin pump), Type 2, Insulin DependentHypothyroidism   Renal/GU Renal disease (stage III CKD)     Musculoskeletal   Abdominal   Peds  Hematology Hodgkin lymphoma   Anesthesia Other Findings   Reproductive/Obstetrics                            Anesthesia Physical Anesthesia Plan  ASA: III  Anesthesia Plan: General   Post-op Pain Management:    Induction: Intravenous  PONV Risk Score and Plan: 2 and Propofol infusion and TIVA  Airway Management Planned: Natural Airway  Additional Equipment:   Intra-op Plan:   Post-operative Plan:   Informed Consent: I have reviewed the patients History and Physical, chart, labs and discussed the procedure including the risks, benefits and alternatives for the proposed anesthesia with the patient or authorized representative who has indicated his/her  understanding and acceptance.     Plan Discussed with: CRNA  Anesthesia Plan Comments:         Anesthesia Quick Evaluation

## 2018-01-11 ENCOUNTER — Ambulatory Visit: Payer: Medicare Other | Admitting: Anesthesiology

## 2018-01-11 ENCOUNTER — Ambulatory Visit
Admission: RE | Admit: 2018-01-11 | Discharge: 2018-01-11 | Disposition: A | Discharge status: Home / Self Care | Payer: Medicare Other | Source: Ambulatory Visit | Attending: Gastroenterology | Admitting: Gastroenterology

## 2018-01-11 ENCOUNTER — Encounter
Admission: RE | Disposition: A | Discharge status: Home / Self Care | Payer: Self-pay | Source: Ambulatory Visit | Attending: Gastroenterology

## 2018-01-11 DIAGNOSIS — D123 Benign neoplasm of transverse colon: Secondary | ICD-10-CM | POA: Insufficient documentation

## 2018-01-11 DIAGNOSIS — D125 Benign neoplasm of sigmoid colon: Secondary | ICD-10-CM | POA: Diagnosis not present

## 2018-01-11 DIAGNOSIS — K6389 Other specified diseases of intestine: Secondary | ICD-10-CM

## 2018-01-11 DIAGNOSIS — N183 Chronic kidney disease, stage 3 (moderate): Secondary | ICD-10-CM | POA: Diagnosis not present

## 2018-01-11 DIAGNOSIS — K2950 Unspecified chronic gastritis without bleeding: Secondary | ICD-10-CM | POA: Insufficient documentation

## 2018-01-11 DIAGNOSIS — Z8571 Personal history of Hodgkin lymphoma: Secondary | ICD-10-CM | POA: Diagnosis not present

## 2018-01-11 DIAGNOSIS — D120 Benign neoplasm of cecum: Secondary | ICD-10-CM | POA: Diagnosis not present

## 2018-01-11 DIAGNOSIS — Z79899 Other long term (current) drug therapy: Secondary | ICD-10-CM | POA: Insufficient documentation

## 2018-01-11 DIAGNOSIS — R109 Unspecified abdominal pain: Secondary | ICD-10-CM | POA: Diagnosis not present

## 2018-01-11 DIAGNOSIS — Z9641 Presence of insulin pump (external) (internal): Secondary | ICD-10-CM | POA: Diagnosis not present

## 2018-01-11 DIAGNOSIS — Z880 Allergy status to penicillin: Secondary | ICD-10-CM | POA: Diagnosis not present

## 2018-01-11 DIAGNOSIS — D122 Benign neoplasm of ascending colon: Secondary | ICD-10-CM | POA: Diagnosis not present

## 2018-01-11 DIAGNOSIS — Z7982 Long term (current) use of aspirin: Secondary | ICD-10-CM | POA: Diagnosis not present

## 2018-01-11 DIAGNOSIS — I252 Old myocardial infarction: Secondary | ICD-10-CM | POA: Diagnosis not present

## 2018-01-11 DIAGNOSIS — R197 Diarrhea, unspecified: Secondary | ICD-10-CM | POA: Diagnosis not present

## 2018-01-11 DIAGNOSIS — I509 Heart failure, unspecified: Secondary | ICD-10-CM | POA: Diagnosis not present

## 2018-01-11 DIAGNOSIS — Z794 Long term (current) use of insulin: Secondary | ICD-10-CM | POA: Insufficient documentation

## 2018-01-11 DIAGNOSIS — D124 Benign neoplasm of descending colon: Secondary | ICD-10-CM | POA: Diagnosis not present

## 2018-01-11 DIAGNOSIS — E1122 Type 2 diabetes mellitus with diabetic chronic kidney disease: Secondary | ICD-10-CM | POA: Diagnosis not present

## 2018-01-11 DIAGNOSIS — E785 Hyperlipidemia, unspecified: Secondary | ICD-10-CM | POA: Diagnosis not present

## 2018-01-11 DIAGNOSIS — I420 Dilated cardiomyopathy: Secondary | ICD-10-CM | POA: Diagnosis not present

## 2018-01-11 DIAGNOSIS — Z9481 Bone marrow transplant status: Secondary | ICD-10-CM | POA: Insufficient documentation

## 2018-01-11 DIAGNOSIS — Z8249 Family history of ischemic heart disease and other diseases of the circulatory system: Secondary | ICD-10-CM | POA: Diagnosis not present

## 2018-01-11 HISTORY — PX: ESOPHAGOGASTRODUODENOSCOPY (EGD) WITH PROPOFOL: SHX5813

## 2018-01-11 HISTORY — PX: POLYPECTOMY: SHX5525

## 2018-01-11 HISTORY — PX: COLONOSCOPY WITH PROPOFOL: SHX5780

## 2018-01-11 LAB — GLUCOSE, CAPILLARY
GLUCOSE-CAPILLARY: 164 mg/dL — AB (ref 65–99)
Glucose-Capillary: 100 mg/dL — ABNORMAL HIGH (ref 65–99)

## 2018-01-11 SURGERY — ESOPHAGOGASTRODUODENOSCOPY (EGD) WITH PROPOFOL
Anesthesia: General | Site: Throat | Wound class: Clean Contaminated

## 2018-01-11 MED ORDER — STERILE WATER FOR IRRIGATION IR SOLN
Status: DC | PRN
  Administered 2018-01-11: .5 mL

## 2018-01-11 MED ORDER — PROPOFOL 10 MG/ML IV BOLUS
INTRAVENOUS | Status: DC | PRN
  Administered 2018-01-11: 30 mg via INTRAVENOUS
  Administered 2018-01-11: 20 mg via INTRAVENOUS
  Administered 2018-01-11: 30 mg via INTRAVENOUS
  Administered 2018-01-11 (×2): 20 mg via INTRAVENOUS
  Administered 2018-01-11 (×2): 30 mg via INTRAVENOUS
  Administered 2018-01-11: 10 mg via INTRAVENOUS
  Administered 2018-01-11: 20 mg via INTRAVENOUS
  Administered 2018-01-11 (×2): 30 mg via INTRAVENOUS
  Administered 2018-01-11 (×2): 20 mg via INTRAVENOUS
  Administered 2018-01-11: 100 mg via INTRAVENOUS
  Administered 2018-01-11: 20 mg via INTRAVENOUS
  Administered 2018-01-11 (×3): 50 mg via INTRAVENOUS
  Administered 2018-01-11: 30 mg via INTRAVENOUS
  Administered 2018-01-11 (×2): 20 mg via INTRAVENOUS
  Administered 2018-01-11 (×2): 30 mg via INTRAVENOUS
  Administered 2018-01-11: 10 mg via INTRAVENOUS
  Administered 2018-01-11 (×3): 30 mg via INTRAVENOUS
  Administered 2018-01-11: 40 mg via INTRAVENOUS
  Administered 2018-01-11: 20 mg via INTRAVENOUS
  Administered 2018-01-11: 30 mg via INTRAVENOUS

## 2018-01-11 MED ORDER — ACETAMINOPHEN 325 MG PO TABS: 650.0000 mg | ORAL_TABLET | Freq: Once | ORAL | Status: DC | PRN

## 2018-01-11 MED ORDER — DEXMEDETOMIDINE HCL 200 MCG/2ML IV SOLN
INTRAVENOUS | Status: DC | PRN
  Administered 2018-01-11: 8 ug via INTRAVENOUS

## 2018-01-11 MED ORDER — LIDOCAINE HCL (CARDIAC) 20 MG/ML IV SOLN
INTRAVENOUS | Status: DC | PRN
  Administered 2018-01-11: 40 mg via INTRAVENOUS

## 2018-01-11 MED ORDER — SODIUM CHLORIDE 0.9 % IV SOLN: INTRAVENOUS | Status: DC

## 2018-01-11 MED ORDER — GLYCOPYRROLATE 0.2 MG/ML IJ SOLN
INTRAMUSCULAR | Status: DC | PRN
  Administered 2018-01-11: .2 mg via INTRAVENOUS

## 2018-01-11 MED ORDER — ACETAMINOPHEN 160 MG/5ML PO SOLN: 325.0000 mg | ORAL | Status: DC | PRN

## 2018-01-11 MED ORDER — LACTATED RINGERS IV SOLN
INTRAVENOUS | Status: DC
  Administered 2018-01-11: 10:00:00 via INTRAVENOUS

## 2018-01-11 MED ORDER — EPHEDRINE SULFATE 50 MG/ML IJ SOLN
INTRAMUSCULAR | Status: DC | PRN
  Administered 2018-01-11 (×2): 10 mg via INTRAVENOUS

## 2018-01-11 MED ORDER — ONDANSETRON HCL 4 MG/2ML IJ SOLN: 4.0000 mg | Freq: Once | INTRAMUSCULAR | Status: DC | PRN

## 2018-01-11 SURGICAL SUPPLY — 12 items
BLOCK BITE 60FR ADLT L/F GRN (MISCELLANEOUS) ×5 IMPLANT
CANISTER SUCT 1200ML W/VALVE (MISCELLANEOUS) ×5 IMPLANT
ELECT REM PT RETURN 9FT ADLT (ELECTROSURGICAL) ×5
ELECTRODE REM PT RTRN 9FT ADLT (ELECTROSURGICAL) ×3 IMPLANT
FORCEPS BIOP RAD 4 LRG CAP 4 (CUTTING FORCEPS) ×5 IMPLANT
GOWN CVR UNV OPN BCK APRN NK (MISCELLANEOUS) ×6 IMPLANT
GOWN ISOL THUMB LOOP REG UNIV (MISCELLANEOUS) ×4
KIT ENDO PROCEDURE OLY (KITS) ×5 IMPLANT
SNARE COLD EXACTO (MISCELLANEOUS) ×5 IMPLANT
SNARE SHORT THROW 13M SML OVAL (MISCELLANEOUS) ×5 IMPLANT
TRAP ETRAP POLY (MISCELLANEOUS) ×5 IMPLANT
WATER STERILE IRR 250ML POUR (IV SOLUTION) ×5 IMPLANT

## 2018-01-11 NOTE — Anesthesia Postprocedure Evaluation (Signed)
Anesthesia Post Note  Patient: ALTA SHOBER  Procedure(s) Performed: ESOPHAGOGASTRODUODENOSCOPY (EGD) WITH PROPOFOL (N/A Throat) COLONOSCOPY WITH PROPOFOL (N/A Rectum) POLYPECTOMY (Rectum)  Patient location during evaluation: PACU Anesthesia Type: General Level of consciousness: awake and alert, oriented and patient cooperative Pain management: pain level controlled Vital Signs Assessment: post-procedure vital signs reviewed and stable Respiratory status: spontaneous breathing, nonlabored ventilation and respiratory function stable Cardiovascular status: blood pressure returned to baseline and stable Postop Assessment: adequate PO intake Anesthetic complications: no    Darrin Nipper

## 2018-01-11 NOTE — Op Note (Signed)
[facility] Gastroenterology Patient Name: Stephen Harmon Procedure Date: 01/11/2018 11:09 AM MRN: [id number] Account #: [id number] Date of Birth: 1961-10-23 Admit Type: Outpatient Age: 57 Room: [facility] OR ROOM 01 Gender: Male Note Status: Finalized Procedure:            Colonoscopy Indications:          Lower abdominal pain, Clinically significant diarrhea                        of unexplained origin Providers:            Lin Landsman MD, MD Referring MD:         Versie Starks, MD (Referring MD) Medicines:            Monitored Anesthesia Care Complications:        No immediate complications. Estimated blood loss:                        Minimal. Procedure:            Pre-Anesthesia Assessment:                       - Prior to the procedure, a History and Physical was                        performed, and patient medications and allergies were                        reviewed. The patient is competent. The risks and                        benefits of the procedure and the sedation options and                        risks were discussed with the patient. All questions                        were answered and informed consent was obtained.                        Patient identification and proposed procedure were                        verified by the physician, the nurse, the                        anesthesiologist, the anesthetist and the technician in                        the pre-procedure area in the procedure room. Mental                        Status Examination: alert and oriented. Airway                        Examination: normal oropharyngeal airway and neck                        mobility. Respiratory Examination: clear to  auscultation. CV Examination: normal. Prophylactic                        Antibiotics: The patient does not require prophylactic                        antibiotics. Prior Anticoagulants: The patient has                  taken no previous anticoagulant or antiplatelet agents.                        ASA Grade Assessment: III - A patient with severe                        systemic disease. After reviewing the risks and                        benefits, the patient was deemed in satisfactory                        condition to undergo the procedure. The anesthesia plan                        was to use monitored anesthesia care (MAC). Immediately                        prior to administration of medications, the patient was                        re-assessed for adequacy to receive sedatives. The                        heart rate, respiratory rate, oxygen saturations, blood                        pressure, adequacy of pulmonary ventilation, and                        response to care were monitored throughout the                        procedure. The physical status of the patient was                        re-assessed after the procedure.                       After obtaining informed consent, the colonoscope was                        passed under direct vision. Throughout the procedure,                        the patient's blood pressure, pulse, and oxygen                        saturations were monitored continuously. The Olympus                        Colonoscope [id number]) was introduced through the  anus and advanced to the 15 cm into the ileum. The                        colonoscopy was technically difficult and complex due                        to the patient's body habitus. Successful completion of                        the procedure was aided by applying abdominal pressure.                        The patient tolerated the procedure well. The quality                        of the bowel preparation was evaluated using the BBPS                        [facility] Bowel Preparation Scale) with scores of: Right                        Colon = 3, Transverse Colon = 3  and Left Colon = 3                        (entire mucosa seen well with no residual staining,                        small fragments of stool or opaque liquid). The total                        BBPS score equals 9. Findings:      The perianal and digital rectal examinations were normal. Pertinent       negatives include normal sphincter tone and no palpable rectal lesions.      A patchy area of the terminal ileum was congested. Biopsies were taken       with a cold forceps for histology.      A 6 mm polyp was found in the cecum. The polyp was sessile. The polyp       was removed with a cold snare. Resection and retrieval were complete.      16 sessile polyps were found in the sigmoid colon, descending colon,       transverse colon and ascending colon. The polyps were 5 to 10 mm in       size. These polyps were removed with cold snare and hot snare depending       on the size. Resection and retrieval were complete. Diminuve polyps were       not removed. A single sessile polyp in descending colon measuring 67m       was unable to be removed as it was on the fold and pt's abdominal       breathing      The retroflexed view of the distal rectum and anal verge was normal and       showed no anal or rectal abnormalities.      Normal mucosa was found in the entire colon. Biopsies for histology were       taken with a cold forceps from the entire colon for evaluation of  microscopic colitis. Impression:           - Congested mucosa in the terminal ileum. Biopsied.                       - One 6 mm polyp in the cecum, removed with a cold                        snare. Resected and retrieved.                       - 16 5 to 10 mm polyps in the sigmoid colon, in the                        descending colon, in the transverse colon and in the                        ascending colon, removed with a hot snare. Resected and                        retrieved.                       - The distal rectum  and anal verge are normal on                        retroflexion view. Recommendation:       - Discharge patient to home.                       - Resume previous diet today.                       - Continue present medications.                       - Await pathology results.                       - Repeat colonoscopy in 1 year for surveillance of                        multiple polyps.                       - Return to my office as previously scheduled.                       - Recommend referral for genetic testing Procedure Code(s):    --- Professional ---                       [phone], Colonoscopy, flexible; with removal of tumor(s),                        polyp(s), or other lesion(s) by snare technique                       30160, 59, Colonoscopy, flexible; with biopsy, single                        or multiple Diagnosis Code(s):    --- Professional ---  K63.89, Other specified diseases of intestine                       D12.0, Benign neoplasm of cecum                       D12.5, Benign neoplasm of sigmoid colon                       D12.4, Benign neoplasm of descending colon                       D12.3, Benign neoplasm of transverse colon (hepatic                        flexure or splenic flexure)                       D12.2, Benign neoplasm of ascending colon                       R10.30, Lower abdominal pain, unspecified                       R19.7, Diarrhea, unspecified CPT copyright 2016 American Medical Association. All rights reserved. The codes documented in this report are preliminary and upon coder review may  be revised to meet current compliance requirements. Dr. Ulyess Mort Lin Landsman MD, MD 01/11/2018 12:35:37 PM This report has been signed electronically. Number of Addenda: 0 Note Initiated On: 01/11/2018 11:09 AM Scope Withdrawal Time: 0 hours 52 minutes 50 seconds  Total Procedure Duration: 0 hours 54 minutes 29 seconds        [facility]

## 2018-01-11 NOTE — Transfer of Care (Signed)
Immediate Anesthesia Transfer of Care Note  Patient: Stephen Harmon  Procedure(s) Performed: ESOPHAGOGASTRODUODENOSCOPY (EGD) WITH PROPOFOL (N/A Throat) COLONOSCOPY WITH PROPOFOL (N/A Rectum) POLYPECTOMY (Rectum)  Patient Location: PACU  Anesthesia Type: General  Level of Consciousness: awake, alert  and patient cooperative  Airway and Oxygen Therapy: Patient Spontanous Breathing and Patient connected to supplemental oxygen  Post-op Assessment: Post-op Vital signs reviewed, Patient's Cardiovascular Status Stable, Respiratory Function Stable, Patent Airway and No signs of Nausea or vomiting  Post-op Vital Signs: Reviewed and stable  Complications: No apparent anesthesia complications

## 2018-01-11 NOTE — Anesthesia Procedure Notes (Signed)
Procedure Name: MAC Date/Time: 01/11/2018 11:09 AM Performed by: Janna Arch, CRNA Pre-anesthesia Checklist: Patient identified, Emergency Drugs available, Suction available and Patient being monitored Patient Re-evaluated:Patient Re-evaluated prior to induction Oxygen Delivery Method: Nasal cannula

## 2018-01-11 NOTE — Op Note (Signed)
[facility] Gastroenterology Patient Name: Stephen Harmon Procedure Date: 01/11/2018 11:04 AM MRN: [id number] Account #: [id number] Date of Birth: 1960/12/31 Admit Type: Outpatient Age: 57 Room: [facility] OR ROOM 01 Gender: Male Note Status: Finalized Procedure:            Upper GI endoscopy Indications:          Generalized abdominal pain, Diarrhea Providers:            Lin Landsman MD, MD Referring MD:         Versie Starks, MD (Referring MD) Medicines:            Monitored Anesthesia Care Complications:        No immediate complications. Estimated blood loss: None. Procedure:            Pre-Anesthesia Assessment:                       - Prior to the procedure, a History and Physical was                        performed, and patient medications and allergies were                        reviewed. The patient is competent. The risks and                        benefits of the procedure and the sedation options and                        risks were discussed with the patient. All questions                        were answered and informed consent was obtained.                        Patient identification and proposed procedure were                        verified by the physician, the nurse, the                        anesthesiologist, the anesthetist and the technician in                        the pre-procedure area in the procedure room. Mental                        Status Examination: alert and oriented. Airway                        Examination: normal oropharyngeal airway and neck                        mobility. Respiratory Examination: clear to                        auscultation. CV Examination: normal. Prophylactic                        Antibiotics: The patient does not require prophylactic  antibiotics. Prior Anticoagulants: The patient has                        taken aspirin, last dose was 1 day prior to procedure.              ASA Grade Assessment: III - A patient with severe                        systemic disease. After reviewing the risks and                        benefits, the patient was deemed in satisfactory                        condition to undergo the procedure. The anesthesia plan                        was to use monitored anesthesia care (MAC). Immediately                        prior to administration of medications, the patient was                        re-assessed for adequacy to receive sedatives. The                        heart rate, respiratory rate, oxygen saturations, blood                        pressure, adequacy of pulmonary ventilation, and                        response to care were monitored throughout the                        procedure. The physical status of the patient was                        re-assessed after the procedure.                       After obtaining informed consent, the endoscope was                        passed under direct vision. Throughout the procedure,                        the patient's blood pressure, pulse, and oxygen                        saturations were monitored continuously. The Olympus                        GIF H180J Endoscope (S#: [id number]) was introduced                        through the mouth, and advanced to the second part of                        duodenum. The upper GI endoscopy  was accomplished                        without difficulty. The patient tolerated the procedure                        well. Findings:      The duodenal bulb and second portion of the duodenum were normal.       Biopsies for histology were taken with a cold forceps for evaluation of       celiac disease.      The entire examined stomach was normal. Biopsies were taken with a cold       forceps for Helicobacter pylori testing.      The cardia and gastric fundus were normal on retroflexion.      The gastroesophageal junction and examined esophagus  were normal. Impression:           - Normal duodenal bulb and second portion of the                        duodenum. Biopsied.                       - Normal stomach. Biopsied.                       - Normal gastroesophageal junction and esophagus. Recommendation:       - Await pathology results.                       - Proceed with colonoscopy as scheduled                       See colonoscopy report Procedure Code(s):    --- Professional ---                       [phone], Esophagogastroduodenoscopy, flexible, transoral;                        with biopsy, single or multiple Diagnosis Code(s):    --- Professional ---                       R10.84, Generalized abdominal pain                       R19.7, Diarrhea, unspecified CPT copyright 2016 American Medical Association. All rights reserved. The codes documented in this report are preliminary and upon coder review may  be revised to meet current compliance requirements. Dr. Ulyess Mort Lin Landsman MD, MD 01/11/2018 11:22:06 AM This report has been signed electronically. Number of Addenda: 0 Note Initiated On: 01/11/2018 11:04 AM      [facility]

## 2018-01-11 NOTE — H&P (Signed)
Stephen Darby, MD [address]  [place]  [place], [place] [address]  Main: [phone]  Fax: [phone] Pager: [phone]  Primary Care Physician:  Patient, No Pcp Per Primary Gastroenterologist:  Dr. Cephas Harmon  Pre-Procedure History & Physical: HPI:  Stephen Harmon is a 57 y.o. male is here for an endoscopy and colonoscopy.   Past Medical History:  Diagnosis Date  . Cancer ([place])    Hodgkin's Lymphoma in the past  . CHF (congestive heart failure) ([place])   . Diabetes mellitus without complication ([place])   . Hodgkin's lymphoma ([place])   . Hyperlipidemia   . Thyroid disease     Past Surgical History:  Procedure Laterality Date  . BONE MARROW TRANSPLANT    . CHOLECYSTECTOMY    . EXPLORATORY LAPAROTOMY    . FOOT SURGERY Bilateral   . HERNIA REPAIR Bilateral    both hernia repair surgeries as a child  . SPLENECTOMY      Prior to Admission medications   Medication Sig Start Date End Date Taking? Authorizing Provider  aspirin 81 MG tablet Take 81 mg by mouth daily.   Yes [provider]  atorvastatin (LIPITOR) 10 MG tablet Take 10 mg by mouth daily.   Yes [provider]  carvedilol (COREG) 6.25 MG tablet Take 6.25 mg by mouth 2 (two) times daily with a meal.   Yes [provider]  furosemide (LASIX) 20 MG tablet Take 1 tablet (20 mg total) by mouth daily as needed for fluid or edema. 03/19/16  Yes Gladstone Lighter, MD  glucose blood test strip 1 each by Other route every 4 (four) hours. Use as instructed   Yes [provider]  insulin regular human CONCENTRATED (HUMULIN R) 500 UNIT/ML injection 325 Units by Subcutaneous Infusion route daily. 11/07/17  Yes [provider]  levothyroxine (SYNTHROID, LEVOTHROID) 25 MCG tablet Take 1 tablet (25 mcg total) by mouth daily before breakfast. 11/27/15  Yes Fisher, Linden Dolin, PA-C  lisinopril (PRINIVIL,ZESTRIL) 2.5 MG tablet Take 2.5 mg by mouth daily.   Yes [provider]  metFORMIN (GLUCOPHAGE-XR) 500 MG 24 hr tablet Take 2 tablets by mouth daily before supper. 10/12/17  Yes [provider]  spironolactone (ALDACTONE) 25 MG tablet Take 25 mg by mouth daily.   Yes [provider]  allopurinol (ZYLOPRIM) 100 MG tablet TAKE ONE (1) TABLET BY MOUTH EVERY DAY 06/09/17   Fisher, Linden Dolin, PA-C  dicyclomine (BENTYL) 20 MG tablet TAKE 1 TABLET BY MOUTH EVERY 6 HOURS 01/09/18   Lin Landsman, MD    Allergies as of 01/05/2018 - Review Complete 01/04/2018  Allergen Reaction Noted  . Amoxicillin Hives 01/30/2016    Family History  Problem Relation Age of Onset  . Heart disease Father   . Diabetes Father     Social History   Socioeconomic History  . Marital status: Married    Spouse name: Not on file  . Number of children: Not on file  . Years of education: Not on file  . Highest education level: Not on file  Social Needs  . Financial resource strain: Not on file  . Food insecurity - worry: Not on file  . Food insecurity - inability: Not on file  . Transportation needs - medical: Not on file  . Transportation needs - non-medical: Not on file  Occupational History  . Occupation: retired Engineer, agricultural.  Tobacco Use  . Smoking status: Never Smoker  . Smokeless tobacco: Never Used  Substance and Sexual Activity  . Alcohol use: Yes    Alcohol/week: 0.0 oz    Comment: wine occasionally  . Drug use: No  . Sexual activity: Not on file  Other Topics Concern  . Not on file  Social History Narrative  . Not on file    Review of Systems: See HPI, otherwise negative ROS  Physical Exam: Ht 5\' 8"  (1.727 m)   Wt 213 lb (96.6 kg)   BMI 32.39 kg/m  General:   Alert,  pleasant and cooperative in NAD Head:  Normocephalic and atraumatic. Neck:  Supple; no masses or thyromegaly. Lungs:  Clear throughout to auscultation.    Heart:  Regular rate and rhythm. Abdomen:  Soft, nontender and nondistended. Normal bowel sounds, without guarding,  and without rebound.   Neurologic:  Alert and  oriented x4;  grossly normal neurologically.  Impression/Plan: Stephen Harmon is here for an endoscopy and colonoscopy to be performed for diarrhea, abdominal pain  Risks, benefits, limitations, and alternatives regarding  endoscopy and colonoscopy have been reviewed with the patient.  Questions have been answered.  All parties agreeable.   Sherri Sear, MD  01/11/2018, 9:17 AM

## 2018-01-13 ENCOUNTER — Other Ambulatory Visit
Admission: RE | Admit: 2018-01-13 | Discharge: 2018-01-13 | Disposition: A | Discharge status: Home / Self Care | Payer: Medicare Other | Source: Ambulatory Visit | Attending: Gastroenterology | Admitting: Gastroenterology

## 2018-01-13 DIAGNOSIS — K529 Noninfective gastroenteritis and colitis, unspecified: Secondary | ICD-10-CM | POA: Diagnosis present

## 2018-01-13 DIAGNOSIS — D649 Anemia, unspecified: Secondary | ICD-10-CM | POA: Insufficient documentation

## 2018-01-13 LAB — CBC
HEMATOCRIT: 38.5 % — AB (ref 40.0–52.0)
Hemoglobin: 13 g/dL (ref 13.0–18.0)
MCH: 32.9 pg (ref 26.0–34.0)
MCHC: 33.9 g/dL (ref 32.0–36.0)
MCV: 97 fL (ref 80.0–100.0)
Platelets: 359 10*3/uL (ref 150–440)
RBC: 3.97 MIL/uL — AB (ref 4.40–5.90)
RDW: 14.5 % (ref 11.5–14.5)
WBC: 10.7 10*3/uL — AB (ref 3.8–10.6)

## 2018-01-13 LAB — IRON AND TIBC
Iron: 93 ug/dL (ref 45–182)
Saturation Ratios: 23 % (ref 17.9–39.5)
TIBC: 400 ug/dL (ref 250–450)
UIBC: 307 ug/dL

## 2018-01-13 LAB — FERRITIN: Ferritin: 214 ng/mL (ref 24–336)

## 2018-01-13 LAB — C-REACTIVE PROTEIN: CRP: <0.8 mg/dL (ref <1.0)

## 2018-01-13 NOTE — Addendum Note (Signed)
Addended by: Santiago Bur on: 01/13/2018 09:41 AM   Modules accepted: Orders

## 2018-01-13 NOTE — Addendum Note (Signed)
Addended by: Santiago Bur on: 01/13/2018 09:42 AM   Modules accepted: Orders

## 2018-01-13 NOTE — Addendum Note (Signed)
Addended by: Santiago Bur on: 01/13/2018 09:40 AM   Modules accepted: Orders

## 2018-01-13 NOTE — Addendum Note (Signed)
Addended by: Santiago Bur on: 01/13/2018 09:39 AM   Modules accepted: Orders

## 2018-01-15 LAB — TISSUE TRANSGLUTAMINASE, IGA

## 2018-01-16 ENCOUNTER — Encounter: Payer: Self-pay | Admitting: Gastroenterology

## 2018-01-16 ENCOUNTER — Other Ambulatory Visit: Payer: Self-pay

## 2018-01-16 DIAGNOSIS — D126 Benign neoplasm of colon, unspecified: Secondary | ICD-10-CM

## 2018-01-17 NOTE — Progress Notes (Signed)
Referral entered LVM for Diane in [place] to make sure she received the referral.

## 2018-01-19 ENCOUNTER — Other Ambulatory Visit
Admission: RE | Admit: 2018-01-19 | Discharge: 2018-01-19 | Disposition: A | Discharge status: Home / Self Care | Payer: Medicare Other | Source: Ambulatory Visit | Attending: Gastroenterology | Admitting: Gastroenterology

## 2018-01-19 ENCOUNTER — Encounter: Payer: Self-pay | Admitting: Genetic Counselor

## 2018-01-19 ENCOUNTER — Telehealth: Payer: Self-pay | Admitting: Genetic Counselor

## 2018-01-19 DIAGNOSIS — K529 Noninfective gastroenteritis and colitis, unspecified: Secondary | ICD-10-CM | POA: Insufficient documentation

## 2018-01-19 LAB — C DIFFICILE QUICK SCREEN W PCR REFLEX
C DIFFICILE (CDIFF) TOXIN: NEGATIVE
C DIFFICLE (CDIFF) ANTIGEN: NEGATIVE
C Diff interpretation: NOT DETECTED

## 2018-01-19 LAB — GASTROINTESTINAL PANEL BY PCR, STOOL (REPLACES STOOL CULTURE)

## 2018-01-19 NOTE — Telephone Encounter (Signed)
Pt has been scheduled to see Roma Kayser on 4/10 at [place] was sent to the referring office to notify the pt. Letter mailed.

## 2018-01-20 ENCOUNTER — Ambulatory Visit: Payer: Self-pay | Admitting: Oncology

## 2018-01-20 LAB — CALPROTECTIN, FECAL

## 2018-01-27 ENCOUNTER — Encounter: Payer: Self-pay | Admitting: Gastroenterology

## 2018-01-27 ENCOUNTER — Ambulatory Visit (INDEPENDENT_AMBULATORY_CARE_PROVIDER_SITE_OTHER): Payer: Medicare Other | Admitting: Gastroenterology

## 2018-01-27 VITALS — BP 117/68 | HR 62 | Ht 68.0 in | Wt 211.4 lb

## 2018-01-27 DIAGNOSIS — K580 Irritable bowel syndrome with diarrhea: Secondary | ICD-10-CM

## 2018-01-27 MED ORDER — AMITRIPTYLINE HCL 25 MG PO TABS: 25.0000 mg | ORAL_TABLET | Freq: Every day | ORAL | 0 refills | Status: DC

## 2018-01-27 MED ORDER — DICYCLOMINE HCL 20 MG PO TABS: 20.0000 mg | ORAL_TABLET | Freq: Four times a day (QID) | ORAL | 0 refills | Status: DC

## 2018-01-27 NOTE — Progress Notes (Signed)
Stephen Darby, MD [address]  [place]  [place], [place] [address]  Main: [phone]  Fax: [phone]    Gastroenterology Consultation  Referring Provider:     Versie Starks, PA-C Primary Care Physician:  Patient, No Pcp Per Primary Gastroenterologist:  Dr. Cephas Harmon Reason for Consultation:     Diarrhea and abdominal pain        HPI:   Stephen Harmon is a 57 y.o. male referred by Dr. Patient, No Pcp Per  for consultation & management of diarrhea and abdominal pain. He reports 3 months history of diffuse abdominal cramps, associated with nonbloody diarrhea, 4-6 times daily. The symptoms are mostly postprandial. He denies nocturnal diarrhea or incontinence. His weight has been stable overall, may have lost about 5 pounds within last 3 months. The symptoms have started around Thanksgiving. He is taking Bentyl as needed which helps with cramps. He consumes red meat regularly, uses Splenda in ice tea and coffee. He denies nausea, vomiting, blood in stools, fever, chills, night sweats. He has history of  Hodgkin's lymphoma and underwent stem cell transplant several years ago. He also has history of nonischemic cardiomyopathy and CHF. 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 Patient is accompanied by his wife today. He did not have any stool workup done. He denies smoking or alcohol He denies recent travel or sick contacts or using well water or antibiotic use or other viral illness  Follow-up visit 01/27/2018 Patient underwent workup for chronic diarrhea and negative for infectious etiology, inflammatory bowel disease, microscopic colitis, celiac disease, eosinophilic enteritis. His gastric, duodenal, ileal, colon biopsies came back unremarkable. He did have multiple small polyps which were tubular adenomas. He has appt with genetic counselor at [place] on 03/01/2018 at 1 PM, he  said he won't be able to make it as he is going to be out of town. Patient reports that he is doing significantly better, his bowel movements are down to 2 per day, soft in consistency, nonbloody. He is taking dicyclomine twice daily, IB guard, cut back on treatment meat, has avoided ice tea with splenda. His weight is stable. Patient and his wife are pleased with the way things are going for him. His last hemoglobin A1c in 11/2017 was 6.9, previously it was 7.2. He is on insulin pump  NSAIDs: none  Antiplts/Anticoagulants/Anti thrombotics: none  GI Procedures:  EGD and colonoscopy 01/11/2018 EGD is unremarkable Colonoscopy - Congested mucosa in the terminal ileum. Biopsied. - One 6 mm polyp in the cecum, removed with a cold snare. Resected and retrieved. - 16 5 to 10 mm polyps in the sigmoid colon, in the descending colon, in the transverse colon and in the ascending colon, removed with a hot snare. Resected and retrieved. - The distal rectum and anal verge are normal on retroflexion view.   Past Medical History:  Diagnosis Date  . Cancer ([place])    Hodgkin's Lymphoma in the past  . CHF (congestive heart failure) ([place])   . Diabetes mellitus without complication ([place])   . Hodgkin's lymphoma ([place])   . Hyperlipidemia   . Thyroid disease     Past Surgical History:  Procedure Laterality Date  . BONE MARROW TRANSPLANT    . CHOLECYSTECTOMY    . COLONOSCOPY WITH PROPOFOL N/A 01/11/2018   Procedure: COLONOSCOPY WITH PROPOFOL;  Surgeon: Lin Landsman, MD;  Location:  [facility];  Service: Endoscopy;  Laterality: N/A;  . ESOPHAGOGASTRODUODENOSCOPY (EGD) WITH PROPOFOL N/A 01/11/2018   Procedure: ESOPHAGOGASTRODUODENOSCOPY (EGD) WITH PROPOFOL;  Surgeon: Lin Landsman, MD;  Location: [facility];  Service: Endoscopy;  Laterality: N/A;  diabetic-insulin pump  . EXPLORATORY LAPAROTOMY    . FOOT SURGERY Bilateral   . HERNIA REPAIR Bilateral    both hernia repair  surgeries as a child  . POLYPECTOMY  01/11/2018   Procedure: POLYPECTOMY;  Surgeon: Lin Landsman, MD;  Location: [facility];  Service: Endoscopy;;  . SPLENECTOMY       Current Outpatient Medications:  .  allopurinol (ZYLOPRIM) 100 MG tablet, TAKE ONE (1) TABLET BY MOUTH EVERY DAY, Disp: 30 tablet, Rfl: 12 .  aspirin 81 MG tablet, Take 81 mg by mouth daily., Disp: , Rfl:  .  atorvastatin (LIPITOR) 10 MG tablet, Take 10 mg by mouth daily., Disp: , Rfl:  .  carvedilol (COREG) 6.25 MG tablet, Take 6.25 mg by mouth 2 (two) times daily with a meal., Disp: , Rfl:  .  dicyclomine (BENTYL) 20 MG tablet, Take 1 tablet (20 mg total) by mouth every 6 (six) hours., Disp: 120 tablet, Rfl: 0 .  furosemide (LASIX) 20 MG tablet, Take 1 tablet (20 mg total) by mouth daily as needed for fluid or edema., Disp: 30 tablet, Rfl: 0 .  glucose blood test strip, 1 each by Other route every 4 (four) hours. Use as instructed, Disp: , Rfl:  .  insulin regular human CONCENTRATED (HUMULIN R) 500 UNIT/ML injection, 325 Units by Subcutaneous Infusion route daily., Disp: , Rfl:  .  levothyroxine (SYNTHROID, LEVOTHROID) 25 MCG tablet, Take 1 tablet (25 mcg total) by mouth daily before breakfast., Disp: 30 tablet, Rfl: 6 .  lisinopril (PRINIVIL,ZESTRIL) 2.5 MG tablet, Take 2.5 mg by mouth daily., Disp: , Rfl:  .  metFORMIN (GLUCOPHAGE-XR) 500 MG 24 hr tablet, Take 2 tablets by mouth daily before supper., Disp: , Rfl:  .  spironolactone (ALDACTONE) 25 MG tablet, Take 25 mg by mouth daily., Disp: , Rfl:  .  amitriptyline (ELAVIL) 25 MG tablet, Take 1 tablet (25 mg total) by mouth at bedtime., Disp: 30 tablet, Rfl: 0   Family History  Problem Relation Age of Onset  . Heart disease Father   . Diabetes Father      Social History   Tobacco Use  . Smoking status: Never Smoker  . Smokeless tobacco: Never Used  Substance Use Topics  . Alcohol use: Yes    Alcohol/week: 0.0 oz    Comment: wine occasionally  .  Drug use: No    Allergies as of 01/27/2018 - Review Complete 01/27/2018  Allergen Reaction Noted  . Amoxicillin Hives 01/30/2016    Review of Systems:    All systems reviewed and negative except where noted in HPI.   Physical Exam:  BP 117/68   Pulse 62   Ht 5\' 8"  (1.727 m)   Wt 211 lb 6.4 oz (95.9 kg)   BMI 32.14 kg/m  No LMP for male patient.  General:   Alert,  Well-developed, well-nourished, pleasant and cooperative in NAD Head:  Normocephalic and atraumatic. Eyes:  Sclera clear, no icterus.   Conjunctiva pink. Ears:  Normal auditory acuity. Nose:  No deformity, discharge, or lesions. Mouth:  No deformity or lesions,oropharynx pink & moist. Neck:  Supple; no masses or thyromegaly. Lungs:  Respirations even and unlabored.  Clear throughout to auscultation.   No wheezes, crackles, or  rhonchi. No acute distress. Heart:  Regular rate and rhythm; no murmurs, clicks, rubs, or gallops. Abdomen:  Normal bowel sounds. Soft, non-tender and non-distended without masses, hepatosplenomegaly or hernias noted.  No guarding or rebound tenderness.   Rectal: Not performed Msk:  Symmetrical without gross deformities. Good, equal movement & strength bilaterally. Pulses:  Normal pulses noted. Extremities:  No clubbing or edema.  No cyanosis. Neurologic:  Alert and oriented x3;  grossly normal neurologically. Skin:  Intact without significant lesions or rashes. No jaundice. Lymph Nodes:  No significant cervical adenopathy. Psych:  Alert and cooperative. Normal mood and affect.  Imaging Studies: none  Assessment and Plan:   WERNER LABELLA is a 57 y.o. male with history of Hodgkin's lymphoma status post stem cell transplant, nonischemic cardiomyopathy presents for follow-up of 3 months history of abdominal cramps and nonbloody diarrhea, predominantly postprandial. Workup negative for inflammatory bowel disease or microscopic colitis or celiac disease, malignancy or chronic nonocclusive  ischemic colitis or infectious colitis. He has diarrhea predominant IBS  - Discussed about initiation of amitriptyline and patient agreed to try - Start amitriptyline 25 mg at bedtime, reviewed side effects - Continue to minimize intake of red meat/processed foods and and avoid artificial sweeteners - Continue Bentyl 20 mg twice a day - continue IB guard  Multiple colon polyps/tubular adenomas: - Referred to genetic counseling at [facility], highly recommend - Repeat colonoscopy in 1 year  Follow up in 9months   Stephen Darby, MD

## 2018-01-31 ENCOUNTER — Telehealth: Payer: Self-pay

## 2018-01-31 NOTE — Telephone Encounter (Signed)
  Patient has been notified regarding his genetic counseling appt that has been scheduled for him to see Roma Kayser on 4/10 at 1pm. Pt will need to arrive 30 minutes early. I will mail the pt a letter. If he needs to reschedule please have him call me at [phone].   Thanks [employer]

## 2018-02-20 ENCOUNTER — Other Ambulatory Visit: Payer: Self-pay

## 2018-02-20 ENCOUNTER — Other Ambulatory Visit: Payer: Self-pay | Admitting: Gastroenterology

## 2018-02-20 DIAGNOSIS — K580 Irritable bowel syndrome with diarrhea: Secondary | ICD-10-CM

## 2018-02-20 MED ORDER — AMITRIPTYLINE HCL 25 MG PO TABS: 25.0000 mg | ORAL_TABLET | Freq: Every day | ORAL | 3 refills | Status: DC

## 2018-02-23 ENCOUNTER — Other Ambulatory Visit: Payer: Self-pay | Admitting: Gastroenterology

## 2018-02-23 DIAGNOSIS — K580 Irritable bowel syndrome with diarrhea: Secondary | ICD-10-CM

## 2018-03-01 ENCOUNTER — Encounter: Payer: Self-pay | Admitting: Genetic Counselor

## 2018-03-01 ENCOUNTER — Other Ambulatory Visit: Payer: Self-pay

## 2018-05-01 ENCOUNTER — Ambulatory Visit (INDEPENDENT_AMBULATORY_CARE_PROVIDER_SITE_OTHER): Payer: Medicare Other | Admitting: Gastroenterology

## 2018-05-01 ENCOUNTER — Encounter: Payer: Self-pay | Admitting: Gastroenterology

## 2018-05-01 DIAGNOSIS — K580 Irritable bowel syndrome with diarrhea: Secondary | ICD-10-CM | POA: Diagnosis not present

## 2018-05-01 MED ORDER — DICYCLOMINE HCL 20 MG PO TABS: 20.0000 mg | ORAL_TABLET | Freq: Three times a day (TID) | ORAL | 3 refills | Status: DC

## 2018-05-01 NOTE — Progress Notes (Signed)
Stephen Darby, MD [address]  [place]  [place], [place] [address]  Main: [phone]  Fax: [phone]    Gastroenterology Consultation  Referring Provider:     No ref. provider found Primary Care Physician:  Patient, No Pcp Per Primary Gastroenterologist:  Dr. Cephas Harmon Reason for Consultation:     Diarrhea and abdominal pain        HPI:   Stephen Harmon is a 57 y.o. male referred by Dr. Patient, No Pcp Per  for consultation & management of diarrhea and abdominal pain. He reports 3 months history of diffuse abdominal cramps, associated with nonbloody diarrhea, 4-6 times daily. The symptoms are mostly postprandial. He denies nocturnal diarrhea or incontinence. His weight has been stable overall, may have lost about 5 pounds within last 3 months. The symptoms have started around Thanksgiving. He is taking Bentyl as needed which helps with cramps. He consumes red meat regularly, uses Splenda in ice tea and coffee. He denies nausea, vomiting, blood in stools, fever, chills, night sweats. He has history of  Hodgkin's lymphoma and underwent stem cell transplant several years ago. He also has history of nonischemic cardiomyopathy and CHF. 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 Patient is accompanied by his wife today. He did not have any stool workup done. He denies smoking or alcohol He denies recent travel or sick contacts or using well water or antibiotic use or other viral illness  Follow-up visit 01/27/2018 Patient underwent workup for chronic diarrhea and negative for infectious etiology, inflammatory bowel disease, microscopic colitis, celiac disease, eosinophilic enteritis. His gastric, duodenal, ileal, colon biopsies came back unremarkable. He did have multiple small polyps which were tubular adenomas. He has appt with genetic counselor at [facility] on 03/01/2018 at 1 PM, he  said he won't be able to make it as he is going to be out of town. Patient reports that he is doing significantly better, his bowel movements are down to 2 per day, soft in consistency, nonbloody. He is taking dicyclomine twice daily, IB guard, cut back on treatment meat, has avoided ice tea with splenda. His weight is stable. Patient and his wife are pleased with the way things are going for him. His last hemoglobin A1c in 11/2017 was 6.9, previously it was 7.2. He is on insulin pump  Follow-up visit 05/01/2018 His symptoms are in remission on amitriptyline 25 mg at bedtime, Bentyl 20 mg 4 times daily and IB guard. He ran out of Bentyl for about a week which resulted in recurrence of his IBS symptoms. Currently, he is asymptomatic. He finds IB guard to be expensive to buy over-the-counter compared to his prescription medications  NSAIDs: none  Antiplts/Anticoagulants/Anti thrombotics: none  GI Procedures:  EGD and colonoscopy 01/11/2018 EGD is unremarkable Colonoscopy - Congested mucosa in the terminal ileum. Biopsied. - One 6 mm polyp in the cecum, removed with a cold snare. Resected and retrieved. - 16 5 to 10 mm polyps in the sigmoid colon, in the descending colon, in the transverse colon and in the ascending colon, removed with a hot snare. Resected and retrieved. - The distal rectum and anal verge are normal on retroflexion view.   Past Medical History:  Diagnosis Date  . Cancer ([place])    Hodgkin's Lymphoma in the past  . CHF (congestive heart failure) ([facility])   . Diabetes mellitus without complication (  [facility])   . Hodgkin's lymphoma ([facility])   . Hyperlipidemia   . Thyroid disease     Past Surgical History:  Procedure Laterality Date  . BONE MARROW TRANSPLANT    . CHOLECYSTECTOMY    . COLONOSCOPY WITH PROPOFOL N/A 01/11/2018   Procedure: COLONOSCOPY WITH PROPOFOL;  Surgeon: Lin Landsman, MD;  Location: [facility];  Service: Endoscopy;  Laterality: N/A;  .  ESOPHAGOGASTRODUODENOSCOPY (EGD) WITH PROPOFOL N/A 01/11/2018   Procedure: ESOPHAGOGASTRODUODENOSCOPY (EGD) WITH PROPOFOL;  Surgeon: Lin Landsman, MD;  Location: [facility];  Service: Endoscopy;  Laterality: N/A;  diabetic-insulin pump  . EXPLORATORY LAPAROTOMY    . FOOT SURGERY Bilateral   . HERNIA REPAIR Bilateral    both hernia repair surgeries as a child  . POLYPECTOMY  01/11/2018   Procedure: POLYPECTOMY;  Surgeon: Lin Landsman, MD;  Location: [facility];  Service: Endoscopy;;  . SPLENECTOMY       Current Outpatient Medications:  .  allopurinol (ZYLOPRIM) 100 MG tablet, TAKE ONE (1) TABLET BY MOUTH EVERY DAY, Disp: 30 tablet, Rfl: 12 .  amitriptyline (ELAVIL) 25 MG tablet, Take 1 tablet (25 mg total) by mouth at bedtime., Disp: 30 tablet, Rfl: 3 .  aspirin 81 MG tablet, Take 81 mg by mouth daily., Disp: , Rfl:  .  atorvastatin (LIPITOR) 10 MG tablet, Take 10 mg by mouth daily., Disp: , Rfl:  .  carvedilol (COREG) 6.25 MG tablet, Take 6.25 mg by mouth 2 (two) times daily with a meal., Disp: , Rfl:  .  dicyclomine (BENTYL) 20 MG tablet, Take 1 tablet (20 mg total) by mouth 4 (four) times daily -  before meals and at bedtime., Disp: 120 tablet, Rfl: 3 .  furosemide (LASIX) 20 MG tablet, Take 1 tablet (20 mg total) by mouth daily as needed for fluid or edema., Disp: 30 tablet, Rfl: 0 .  glucose blood test strip, 1 each by Other route every 4 (four) hours. Use as instructed, Disp: , Rfl:  .  insulin regular human CONCENTRATED (HUMULIN R) 500 UNIT/ML injection, 325 Units by Subcutaneous Infusion route daily., Disp: , Rfl:  .  levothyroxine (SYNTHROID, LEVOTHROID) 25 MCG tablet, Take 1 tablet (25 mcg total) by mouth daily before breakfast., Disp: 30 tablet, Rfl: 6 .  lisinopril (PRINIVIL,ZESTRIL) 2.5 MG tablet, Take 2.5 mg by mouth daily., Disp: , Rfl:  .  metFORMIN (GLUCOPHAGE-XR) 500 MG 24 hr tablet, Take 2 tablets by mouth daily before supper., Disp: , Rfl:  .   spironolactone (ALDACTONE) 25 MG tablet, Take 25 mg by mouth daily., Disp: , Rfl:    Family History  Problem Relation Age of Onset  . Heart disease Father   . Diabetes Father      Social History   Tobacco Use  . Smoking status: Never Smoker  . Smokeless tobacco: Never Used  Substance Use Topics  . Alcohol use: Yes    Alcohol/week: 0.0 oz    Comment: wine occasionally  . Drug use: No    Allergies as of 05/01/2018 - Review Complete 05/01/2018  Allergen Reaction Noted  . Amoxicillin Hives 01/30/2016    Review of Systems:    All systems reviewed and negative except where noted in HPI.   Physical Exam:  BP 113/65 (BP Location: Left Arm, Patient Position: Sitting, Cuff Size: Large)   Pulse 74   Resp 16   Ht 5\' 8"  (1.727 m)   Wt 211 lb 12.8 oz (96.1 kg)   BMI  32.20 kg/m  No LMP for male patient.  General:   Alert,  Well-developed, well-nourished, pleasant and cooperative in NAD Head:  Normocephalic and atraumatic. Eyes:  Sclera clear, no icterus.   Conjunctiva pink. Ears:  Normal auditory acuity. Nose:  No deformity, discharge, or lesions. Mouth:  No deformity or lesions,oropharynx pink & moist. Neck:  Supple; no masses or thyromegaly. Lungs:  Respirations even and unlabored.  Clear throughout to auscultation.   No wheezes, crackles, or rhonchi. No acute distress. Heart:  Regular rate and rhythm; no murmurs, clicks, rubs, or gallops. Abdomen:  Normal bowel sounds. Soft, non-tender and non-distended without masses, hepatosplenomegaly or hernias noted.  No guarding or rebound tenderness.   Rectal: Not performed Msk:  Symmetrical without gross deformities. Good, equal movement & strength bilaterally. Pulses:  Normal pulses noted. Extremities:  No clubbing or edema.  No cyanosis. Neurologic:  Alert and oriented x3;  grossly normal neurologically. Skin:  Intact without significant lesions or rashes. No jaundice. Lymph Nodes:  No significant cervical adenopathy. Psych:   Alert and cooperative. Normal mood and affect.  Imaging Studies: none  Assessment and Plan:   RAFAEL SALWAY is a 57 y.o. male with history of Hodgkin's lymphoma status post stem cell transplant, nonischemic cardiomyopathy presents for follow-up of 3 months history of abdominal cramps and nonbloody diarrhea, predominantly postprandial. Workup negative for inflammatory bowel disease or microscopic colitis or celiac disease, malignancy or chronic nonocclusive ischemic colitis or infectious colitis. He has diarrhea predominant IBS  - continue amitriptyline, increase to 50 mg at bedtime - Continue to minimize intake of red meat/processed foods and and avoid artificial sweeteners - Continue Bentyl 20 mg 3-4 times a day - continue IB guard  Multiple colon polyps/tubular adenomas: - Referred to genetic counseling at [facility], highly recommend, patient deferred at this time - Repeat colonoscopy in 1 year  Follow up in 39months   Stephen Darby, MD

## 2018-05-30 ENCOUNTER — Other Ambulatory Visit: Payer: Self-pay | Admitting: Gastroenterology

## 2018-05-30 DIAGNOSIS — K580 Irritable bowel syndrome with diarrhea: Secondary | ICD-10-CM

## 2018-06-15 ENCOUNTER — Encounter: Payer: Self-pay | Admitting: Gastroenterology

## 2018-06-16 ENCOUNTER — Other Ambulatory Visit: Payer: Self-pay

## 2018-06-16 DIAGNOSIS — K580 Irritable bowel syndrome with diarrhea: Secondary | ICD-10-CM

## 2018-06-16 MED ORDER — AMITRIPTYLINE HCL 50 MG PO TABS: 50.0000 mg | ORAL_TABLET | Freq: Every day | ORAL | 0 refills | Status: DC

## 2018-06-26 ENCOUNTER — Telehealth: Payer: Self-pay

## 2018-06-26 NOTE — Telephone Encounter (Signed)
Please advise the pharmacy to contact the patient's primary care provider regarding refills for this patient.  Patient was seen in our clinic on 12/28/2017 (by Dr. Everlene Farrier) and was told at that time that he needs to establish with a primary care provider.

## 2018-06-26 NOTE — Telephone Encounter (Signed)
Received fax requesting for Allopurinol 100 MG Tab  Last OV with [place]- 12/28/17 [facility]- February

## 2018-06-26 NOTE — Telephone Encounter (Signed)
Pharmacy notified.

## 2018-08-10 ENCOUNTER — Other Ambulatory Visit: Payer: Self-pay | Admitting: Gastroenterology

## 2018-10-05 ENCOUNTER — Other Ambulatory Visit: Payer: Self-pay | Admitting: Gastroenterology

## 2018-11-01 ENCOUNTER — Ambulatory Visit: Payer: Self-pay | Admitting: Gastroenterology

## 2018-11-01 ENCOUNTER — Encounter: Payer: Self-pay | Admitting: Gastroenterology

## 2018-11-01 ENCOUNTER — Ambulatory Visit (INDEPENDENT_AMBULATORY_CARE_PROVIDER_SITE_OTHER): Payer: Medicare Other | Admitting: Gastroenterology

## 2018-11-01 VITALS — BP 118/72 | HR 101 | Ht 68.0 in | Wt 212.4 lb

## 2018-11-01 DIAGNOSIS — R197 Diarrhea, unspecified: Secondary | ICD-10-CM

## 2018-11-01 DIAGNOSIS — K580 Irritable bowel syndrome with diarrhea: Secondary | ICD-10-CM

## 2018-11-01 NOTE — Progress Notes (Signed)
Stephen Darby, MD [address]  [place]  [place], [place] [address]  Main: [phone]  Fax: [phone]    Gastroenterology Consultation  Referring Provider:     No ref. provider found Primary Care Physician:  Patient, No Pcp Per Primary Gastroenterologist:  Dr. Cephas Harmon Reason for Consultation: Diarrhea predominant IBS        HPI:   Stephen Harmon is a 57 y.o. male referred by Dr. Patient, No Pcp Per  for consultation & management of diarrhea and abdominal pain. He reports 3 months history of diffuse abdominal cramps, associated with nonbloody diarrhea, 4-6 times daily. The symptoms are mostly postprandial. He denies nocturnal diarrhea or incontinence. His weight has been stable overall, may have lost about 5 pounds within last 3 months. The symptoms have started around Thanksgiving. He is taking Bentyl as needed which helps with cramps. He consumes red meat regularly, uses Splenda in ice tea and coffee. He denies nausea, vomiting, blood in stools, fever, chills, night sweats. He has history of  Hodgkin's lymphoma and underwent stem cell transplant several years ago. He also has history of nonischemic cardiomyopathy and CHF. 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 Patient is accompanied by his wife today. He did not have any stool workup done. He denies smoking or alcohol He denies recent travel or sick contacts or using well water or antibiotic use or other viral illness  Follow-up visit 01/27/2018 Patient underwent workup for chronic diarrhea and negative for infectious etiology, inflammatory bowel disease, microscopic colitis, celiac disease, eosinophilic enteritis. His gastric, duodenal, ileal, colon biopsies came back unremarkable. He did have multiple small polyps which were tubular adenomas. He has appt with genetic counselor at [facility] on 03/01/2018 at 1 PM, he said he  won't be able to make it as he is going to be out of town. Patient reports that he is doing significantly better, his bowel movements are down to 2 per day, soft in consistency, nonbloody. He is taking dicyclomine twice daily, IB guard, cut back on treatment meat, has avoided ice tea with splenda. His weight is stable. Patient and his wife are pleased with the way things are going for him. His last hemoglobin A1c in 11/2017 was 6.9, previously it was 7.2. He is on insulin pump  Follow-up visit 05/01/2018 His symptoms are in remission on amitriptyline 25 mg at bedtime, Bentyl 20 mg 4 times daily and IB guard. He ran out of Bentyl for about a week which resulted in recurrence of his IBS symptoms. Currently, he is asymptomatic. He finds IB guard to be expensive to buy over-the-counter compared to his prescription medications  Follow-up visit 1119 Patient has severe back pain and currently receiving steroid injections.  Unable to play golf for the last 2 months due to back pain andis upset about it.  He also reports flareup of his IBS for the last 3 to 4 days.  Currently having 4-5, nonbloody watery diarrheal episodes.  Denies nausea, vomiting or bloating.  He reports his abdominal cramps are better.  He continues to take Bentyl as needed.  He is not taking IBgard anymore as he felt it was not helping.  He continues to take amitriptyline 50 mg at bedtime.  He generally eats outside often.  He denies recent antibiotic use, sick contacts or recent travel.  His weight has been stable.  NSAIDs:  none  Antiplts/Anticoagulants/Anti thrombotics: none  GI Procedures:  EGD and colonoscopy 01/11/2018 EGD is unremarkable Colonoscopy - Congested mucosa in the terminal ileum. Biopsied. - One 6 mm polyp in the cecum, removed with a cold snare. Resected and retrieved. - 16 5 to 10 mm polyps in the sigmoid colon, in the descending colon, in the transverse colon and in the ascending colon, removed with a hot snare.  Resected and retrieved. - The distal rectum and anal verge are normal on retroflexion view.   Past Medical History:  Diagnosis Date  . Cancer ([facility])    Hodgkin's Lymphoma in the past  . CHF (congestive heart failure) ([facility])   . Diabetes mellitus without complication ([facility])   . Hodgkin's lymphoma ([facility])   . Hyperlipidemia   . Thyroid disease     Past Surgical History:  Procedure Laterality Date  . BONE MARROW TRANSPLANT    . CHOLECYSTECTOMY    . COLONOSCOPY WITH PROPOFOL N/A 01/11/2018   Procedure: COLONOSCOPY WITH PROPOFOL;  Surgeon: Lin Landsman, MD;  Location: [facility];  Service: Endoscopy;  Laterality: N/A;  . ESOPHAGOGASTRODUODENOSCOPY (EGD) WITH PROPOFOL N/A 01/11/2018   Procedure: ESOPHAGOGASTRODUODENOSCOPY (EGD) WITH PROPOFOL;  Surgeon: Lin Landsman, MD;  Location: [facility];  Service: Endoscopy;  Laterality: N/A;  diabetic-insulin pump  . EXPLORATORY LAPAROTOMY    . FOOT SURGERY Bilateral   . HERNIA REPAIR Bilateral    both hernia repair surgeries as a child  . POLYPECTOMY  01/11/2018   Procedure: POLYPECTOMY;  Surgeon: Lin Landsman, MD;  Location: [facility];  Service: Endoscopy;;  . SPLENECTOMY       Current Outpatient Medications:  .  allopurinol (ZYLOPRIM) 100 MG tablet, TAKE ONE (1) TABLET BY MOUTH EVERY DAY, Disp: 30 tablet, Rfl: 12 .  amitriptyline (ELAVIL) 50 MG tablet, TAKE ONE TABLET BY MOUTH AT BEDTIME, Disp: 60 tablet, Rfl: 0 .  aspirin 81 MG tablet, Take 81 mg by mouth daily., Disp: , Rfl:  .  carvedilol (COREG) 6.25 MG tablet, Take 6.25 mg by mouth 2 (two) times daily with a meal., Disp: , Rfl:  .  dicyclomine (BENTYL) 20 MG tablet, Take 1 tablet (20 mg total) by mouth 4 (four) times daily -  before meals and at bedtime., Disp: 120 tablet, Rfl: 3 .  furosemide (LASIX) 20 MG tablet, Take 1 tablet (20 mg total) by mouth daily as needed for fluid or edema., Disp: 30 tablet, Rfl: 0 .  gabapentin (NEURONTIN) 300 MG  capsule, , Disp: , Rfl:  .  glucose blood test strip, 1 each by Other route every 4 (four) hours. Use as instructed, Disp: , Rfl:  .  insulin regular human CONCENTRATED (HUMULIN R) 500 UNIT/ML injection, 325 Units by Subcutaneous Infusion route daily., Disp: , Rfl:  .  levothyroxine (SYNTHROID, LEVOTHROID) 25 MCG tablet, Take 1 tablet (25 mcg total) by mouth daily before breakfast., Disp: 30 tablet, Rfl: 6 .  lisinopril (PRINIVIL,ZESTRIL) 2.5 MG tablet, Take 2.5 mg by mouth daily., Disp: , Rfl:  .  metFORMIN (GLUCOPHAGE-XR) 500 MG 24 hr tablet, Take 2 tablets by mouth daily before supper., Disp: , Rfl:  .  spironolactone (ALDACTONE) 25 MG tablet, Take 25 mg by mouth daily., Disp: , Rfl:  .  atorvastatin (LIPITOR) 10 MG tablet, Take 10 mg by mouth daily., Disp: , Rfl:    Family History  Problem Relation Age of Onset  . Heart disease Father   . Diabetes Father  Social History   Tobacco Use  . Smoking status: Never Smoker  . Smokeless tobacco: Never Used  Substance Use Topics  . Alcohol use: Yes    Alcohol/week: 0.0 standard drinks    Comment: wine occasionally  . Drug use: No    Allergies as of 11/01/2018 - Review Complete 11/01/2018  Allergen Reaction Noted  . Amoxicillin Hives 01/30/2016    Review of Systems:    All systems reviewed and negative except where noted in HPI.   Physical Exam:  BP 118/72   Pulse (!) 101   Ht 5\' 8"  (1.727 m)   Wt 212 lb 6.4 oz (96.3 kg)   BMI 32.30 kg/m  No LMP for male patient.  General:   Alert,  Well-developed, well-nourished, pleasant and cooperative in NAD Head:  Normocephalic and atraumatic. Eyes:  Sclera clear, no icterus.   Conjunctiva pink. Ears:  Normal auditory acuity. Nose:  No deformity, discharge, or lesions. Mouth:  No deformity or lesions,oropharynx pink & moist. Neck:  Supple; no masses or thyromegaly. Lungs:  Respirations even and unlabored.  Clear throughout to auscultation.   No wheezes, crackles, or rhonchi. No  acute distress. Heart:  Regular rate and rhythm; no murmurs, clicks, rubs, or gallops. Abdomen:  Normal bowel sounds. Soft, non-tender and non-distended without masses, hepatosplenomegaly or hernias noted.  No guarding or rebound tenderness.   Rectal: Not performed Msk:  Symmetrical without gross deformities. Good, equal movement & strength bilaterally. Pulses:  Normal pulses noted. Extremities:  No clubbing or edema.  No cyanosis. Neurologic:  Alert and oriented x3;  grossly normal neurologically. Skin:  Intact without significant lesions or rashes. No jaundice. Lymph Nodes:  No significant cervical adenopathy. Psych:  Alert and cooperative. Normal mood and affect.  Imaging Studies: none  Assessment and Plan:   Stephen Harmon is a 57 y.o. male with history of Hodgkin's lymphoma status post stem cell transplant, nonischemic cardiomyopathy presents for follow-up of abdominal cramps and nonbloody diarrhea, predominantly postprandial. Workup negative for inflammatory bowel disease or microscopic colitis or celiac disease, malignancy. He has diarrhea predominant IBS.  Currently has flareup with worsening diarrhea for the last 3 to 4 days in the setting of severe back pain  - continue amitriptyline 50 mg at bedtime, suggested him to increase to 75 mg at bedtime - Continue to minimize intake of red meat/processed foods and and avoid artificial sweeteners - Continue Bentyl 20 mg 3-4 times a day - Check stool studies to rule out infection - Trial of Creon - Discussed with him about trial of rifaximin in future if his diarrhea persists and work-up is negative for infection  Multiple colon polyps/tubular adenomas: - Referred to genetic counseling at [facility], highly recommend, patient deferred at this time - Repeat colonoscopy in 12/2018  Follow up in 2-35months   Stephen Darby, MD

## 2018-11-02 ENCOUNTER — Other Ambulatory Visit: Payer: Self-pay | Admitting: Gastroenterology

## 2018-11-04 ENCOUNTER — Encounter: Payer: Self-pay | Admitting: Gastroenterology

## 2018-11-05 LAB — GI PROFILE, STOOL, PCR
ASTROVIRUS: NOT DETECTED
Adenovirus F 40/41: NOT DETECTED
C DIFFICILE TOXIN A/B: NOT DETECTED
Campylobacter: NOT DETECTED
Cryptosporidium: NOT DETECTED
Cyclospora cayetanensis: NOT DETECTED
ENTAMOEBA HISTOLYTICA: NOT DETECTED
ENTEROAGGREGATIVE E COLI: NOT DETECTED
ENTEROPATHOGENIC E COLI: NOT DETECTED
ENTEROTOXIGENIC E COLI: NOT DETECTED
GIARDIA LAMBLIA: NOT DETECTED
Norovirus GI/GII: NOT DETECTED
Plesiomonas shigelloides: NOT DETECTED
Rotavirus A: NOT DETECTED
Salmonella: NOT DETECTED
Sapovirus: NOT DETECTED
Shiga-toxin-producing E coli: NOT DETECTED
Shigella/Enteroinvasive E coli: NOT DETECTED
VIBRIO CHOLERAE: NOT DETECTED
Vibrio: NOT DETECTED
YERSINIA ENTEROCOLITICA: NOT DETECTED

## 2018-11-06 ENCOUNTER — Other Ambulatory Visit: Payer: Self-pay | Admitting: Gastroenterology

## 2018-11-06 DIAGNOSIS — K580 Irritable bowel syndrome with diarrhea: Secondary | ICD-10-CM

## 2018-11-06 MED ORDER — PANCRELIPASE (LIP-PROT-AMYL) 36000-114000 UNITS PO CPEP: 72000.0000 [IU] | ORAL_CAPSULE | Freq: Three times a day (TID) | ORAL | 0 refills | Status: DC

## 2018-11-07 LAB — PANCREATIC ELASTASE, FECAL: Pancreatic Elastase, Fecal: <50 ug Elast./g — ABNORMAL LOW (ref >200)

## 2018-11-07 MED ORDER — PANCRELIPASE (LIP-PROT-AMYL) 36000-114000 UNITS PO CPEP: 72000.0000 [IU] | ORAL_CAPSULE | Freq: Three times a day (TID) | ORAL | 0 refills | Status: DC

## 2018-11-07 NOTE — Telephone Encounter (Signed)
Dr. Verlin Grills patient

## 2018-11-08 ENCOUNTER — Other Ambulatory Visit: Payer: Self-pay | Admitting: Physical Medicine and Rehabilitation

## 2018-11-08 DIAGNOSIS — M5416 Radiculopathy, lumbar region: Secondary | ICD-10-CM

## 2018-11-11 ENCOUNTER — Other Ambulatory Visit: Payer: Self-pay | Admitting: Gastroenterology

## 2018-11-24 ENCOUNTER — Ambulatory Visit
Admission: RE | Admit: 2018-11-24 | Discharge: 2018-11-24 | Disposition: A | Discharge status: Home / Self Care | Payer: Medicare Other | Source: Ambulatory Visit | Attending: Physical Medicine and Rehabilitation | Admitting: Physical Medicine and Rehabilitation

## 2018-11-24 DIAGNOSIS — M5416 Radiculopathy, lumbar region: Secondary | ICD-10-CM | POA: Insufficient documentation

## 2018-12-07 ENCOUNTER — Encounter
Admission: RE | Admit: 2018-12-07 | Discharge: 2018-12-07 | Disposition: A | Discharge status: Home / Self Care | Payer: Medicare Other | Source: Ambulatory Visit | Attending: Neurosurgery | Admitting: Neurosurgery

## 2018-12-07 ENCOUNTER — Other Ambulatory Visit: Payer: Self-pay

## 2018-12-07 DIAGNOSIS — Z01818 Encounter for other preprocedural examination: Secondary | ICD-10-CM | POA: Insufficient documentation

## 2018-12-07 DIAGNOSIS — Z01810 Encounter for preprocedural cardiovascular examination: Secondary | ICD-10-CM | POA: Diagnosis not present

## 2018-12-07 LAB — BASIC METABOLIC PANEL
Anion gap: 9 (ref 5–15)
BUN: 36 mg/dL — AB (ref 6–20)
CO2: 21 mmol/L — ABNORMAL LOW (ref 22–32)
Calcium: 10.2 mg/dL (ref 8.9–10.3)
Chloride: 106 mmol/L (ref 98–111)
Creatinine, Ser: 1.92 mg/dL — ABNORMAL HIGH (ref 0.61–1.24)
GFR calc Af Amer: 43 mL/min — ABNORMAL LOW (ref >60)
GFR, EST NON AFRICAN AMERICAN: 37 mL/min — AB (ref >60)
GLUCOSE: 177 mg/dL — AB (ref 70–99)
POTASSIUM: 4.4 mmol/L (ref 3.5–5.1)
SODIUM: 136 mmol/L (ref 135–145)

## 2018-12-07 LAB — CBC
HCT: 38.3 % — ABNORMAL LOW (ref 39.0–52.0)
Hemoglobin: 12.4 g/dL — ABNORMAL LOW (ref 13.0–17.0)
MCH: 33.2 pg (ref 26.0–34.0)
MCHC: 32.4 g/dL (ref 30.0–36.0)
MCV: 102.7 fL — AB (ref 80.0–100.0)
PLATELETS: 418 10*3/uL — AB (ref 150–400)
RBC: 3.73 MIL/uL — AB (ref 4.22–5.81)
RDW: 14.1 % (ref 11.5–15.5)
WBC: 12.1 10*3/uL — ABNORMAL HIGH (ref 4.0–10.5)
nRBC: 0 % (ref 0.0–0.2)

## 2018-12-07 LAB — APTT: aPTT: 32 seconds (ref 24–36)

## 2018-12-07 LAB — URINALYSIS, ROUTINE W REFLEX MICROSCOPIC
BILIRUBIN URINE: NEGATIVE
Glucose, UA: NEGATIVE mg/dL
Hgb urine dipstick: NEGATIVE
KETONES UR: NEGATIVE mg/dL
LEUKOCYTES UA: NEGATIVE
NITRITE: NEGATIVE
Protein, ur: NEGATIVE mg/dL
Specific Gravity, Urine: 1.015 (ref 1.005–1.030)
pH: 5 (ref 5.0–8.0)

## 2018-12-07 LAB — TYPE AND SCREEN
ABO/RH(D): B POS
ANTIBODY SCREEN: NEGATIVE

## 2018-12-07 LAB — SURGICAL PCR SCREEN
MRSA, PCR: NEGATIVE
Staphylococcus aureus: POSITIVE — AB

## 2018-12-07 LAB — PROTIME-INR
INR: 0.89
PROTHROMBIN TIME: 12 s (ref 11.4–15.2)

## 2018-12-07 NOTE — Pre-Procedure Instructions (Signed)
EKG done today here in PAT showed STEMI.  ST segment elevations.  No symptoms of chest pain or shortness of breath. Dr. Amie Critchley notified.  Stated to let cardiology look at EKG and evaluate.  Pt hand carrying EKG to office. Notified office and will fax a copy as well.

## 2018-12-07 NOTE — Pre-Procedure Instructions (Signed)
[facility] notified of that we asked Dr. Nehemiah Massed  to evaluate and confirm clearance in light of the EKG done in PAT today that show ST segment elevation.

## 2018-12-07 NOTE — Patient Instructions (Signed)
Your procedure is scheduled on: Wed. 1/22 Report to Day Surgery. To find out your arrival time please call [phone] between 1PM - 3PM on Tues 1/21.  Remember: Instructions that are not followed completely may result in serious medical risk,  up to and including death, or upon the discretion of your surgeon and anesthesiologist your  surgery may need to be rescheduled.     _X__ 1. Do not eat food after midnight the night before your procedure.                 No gum chewing or hard candies. You may drink clear liquids up to 2 hours                 before you are scheduled to arrive for your surgery- DO not drink clear                 liquids within 2 hours of the start of your surgery.                 Clear Liquids include:  water,  Black Coffee Tea (Do not add                 anything to coffee or tea).  __X__2.  On the morning of surgery brush your teeth with toothpaste and water, you                may rinse your mouth with mouthwash if you wish.  Do not swallow any toothpaste of mouthwash.     _X__ 3.  No Alcohol for 24 hours before or after surgery.   ___ 4.  Do Not Smoke or use e-cigarettes For 24 Hours Prior to Your Surgery.                 Do not use any chewable tobacco products for at least 6 hours prior to                 surgery.  ____  5.  Bring all medications with you on the day of surgery if instructed.   __x_  6.  Notify your doctor if there is any change in your medical condition      (cold, fever, infections).     Do not wear jewelry, make-up, hairpins, clips or nail polish. Do not wear lotions, powders, or perfumes. You may wear deodorant. Do not shave 48 hours prior to surgery. Men may shave face and neck. Do not bring valuables to the hospital.    [facility] is not responsible for any belongings or valuables.  Contacts, dentures or bridgework may not be worn into surgery. Leave your suitcase in the car. After surgery it may be  brought to your room. For patients admitted to the hospital, discharge time is determined by your treatment team.   Patients discharged the day of surgery will not be allowed to drive home.   Please read over the following fact sheets that you were given:   MRSA Information   __x__ Take these medicines the morning of surgery with A SIP OF WATER:    1.carvedilol (COREG) 6.25 MG tablet  2. levothyroxine (SYNTHROID, LEVOTHROID) 25 MCG tablet  3.   4.  5.  6.  ____ Fleet Enema (as directed)   _x___ Use CHG Soap as directed  ____ Use inhalers on the day of surgery  _x___ Stop metformin 2 days prior to surgery last day 1/19    __x__  Stop insulin pump when you leave home to come to the hospital per Dr. Gabriel Carina          of surgery.   __x__ Stopped aspirin on 1/10  ___x_ Stop Anti-inflammatories aleve, ibuprofen now   ____ Stop supplements until after surgery.    ____ Bring C-Pap to the hospital.

## 2018-12-09 ENCOUNTER — Other Ambulatory Visit: Payer: Self-pay | Admitting: Gastroenterology

## 2018-12-13 ENCOUNTER — Ambulatory Visit: Payer: Medicare Other | Admitting: Registered Nurse

## 2018-12-13 ENCOUNTER — Ambulatory Visit: Payer: Medicare Other

## 2018-12-13 ENCOUNTER — Encounter
Admission: RE | Disposition: A | Discharge status: Home / Self Care | Payer: Self-pay | Source: Home / Self Care | Attending: Neurosurgery

## 2018-12-13 ENCOUNTER — Ambulatory Visit
Admission: RE | Admit: 2018-12-13 | Discharge: 2018-12-13 | Disposition: A | Discharge status: Home / Self Care | Payer: Medicare Other | Attending: Neurosurgery | Admitting: Neurosurgery

## 2018-12-13 ENCOUNTER — Other Ambulatory Visit: Payer: Self-pay

## 2018-12-13 ENCOUNTER — Encounter: Payer: Self-pay | Admitting: *Deleted

## 2018-12-13 DIAGNOSIS — M7138 Other bursal cyst, other site: Secondary | ICD-10-CM | POA: Insufficient documentation

## 2018-12-13 DIAGNOSIS — R809 Proteinuria, unspecified: Secondary | ICD-10-CM | POA: Insufficient documentation

## 2018-12-13 DIAGNOSIS — M48062 Spinal stenosis, lumbar region with neurogenic claudication: Secondary | ICD-10-CM | POA: Diagnosis not present

## 2018-12-13 DIAGNOSIS — Z794 Long term (current) use of insulin: Secondary | ICD-10-CM | POA: Insufficient documentation

## 2018-12-13 DIAGNOSIS — E039 Hypothyroidism, unspecified: Secondary | ICD-10-CM | POA: Diagnosis not present

## 2018-12-13 DIAGNOSIS — E1122 Type 2 diabetes mellitus with diabetic chronic kidney disease: Secondary | ICD-10-CM | POA: Insufficient documentation

## 2018-12-13 DIAGNOSIS — Z419 Encounter for procedure for purposes other than remedying health state, unspecified: Secondary | ICD-10-CM

## 2018-12-13 DIAGNOSIS — Z880 Allergy status to penicillin: Secondary | ICD-10-CM | POA: Insufficient documentation

## 2018-12-13 DIAGNOSIS — I2510 Atherosclerotic heart disease of native coronary artery without angina pectoris: Secondary | ICD-10-CM | POA: Diagnosis not present

## 2018-12-13 DIAGNOSIS — Z8572 Personal history of non-Hodgkin lymphomas: Secondary | ICD-10-CM | POA: Insufficient documentation

## 2018-12-13 DIAGNOSIS — E785 Hyperlipidemia, unspecified: Secondary | ICD-10-CM | POA: Insufficient documentation

## 2018-12-13 DIAGNOSIS — Z79890 Hormone replacement therapy (postmenopausal): Secondary | ICD-10-CM | POA: Diagnosis not present

## 2018-12-13 DIAGNOSIS — N183 Chronic kidney disease, stage 3 (moderate): Secondary | ICD-10-CM | POA: Diagnosis not present

## 2018-12-13 DIAGNOSIS — I420 Dilated cardiomyopathy: Secondary | ICD-10-CM | POA: Insufficient documentation

## 2018-12-13 DIAGNOSIS — Z79899 Other long term (current) drug therapy: Secondary | ICD-10-CM | POA: Diagnosis not present

## 2018-12-13 DIAGNOSIS — Z7982 Long term (current) use of aspirin: Secondary | ICD-10-CM | POA: Diagnosis not present

## 2018-12-13 DIAGNOSIS — I130 Hypertensive heart and chronic kidney disease with heart failure and stage 1 through stage 4 chronic kidney disease, or unspecified chronic kidney disease: Secondary | ICD-10-CM | POA: Insufficient documentation

## 2018-12-13 DIAGNOSIS — I509 Heart failure, unspecified: Secondary | ICD-10-CM | POA: Diagnosis not present

## 2018-12-13 HISTORY — PX: LUMBAR LAMINECTOMY/DECOMPRESSION MICRODISCECTOMY: SHX5026

## 2018-12-13 LAB — GLUCOSE, CAPILLARY
GLUCOSE-CAPILLARY: 83 mg/dL (ref 70–99)
GLUCOSE-CAPILLARY: 161 mg/dL — AB (ref 70–99)
Glucose-Capillary: 67 mg/dL — ABNORMAL LOW (ref 70–99)
Glucose-Capillary: 124 mg/dL — ABNORMAL HIGH (ref 70–99)

## 2018-12-13 LAB — ABO/RH: ABO/RH(D): B POS

## 2018-12-13 SURGERY — LUMBAR LAMINECTOMY/DECOMPRESSION MICRODISCECTOMY 1 LEVEL
Anesthesia: General | Site: Back

## 2018-12-13 MED ORDER — LIDOCAINE HCL (PF) 2 % IJ SOLN
INTRAMUSCULAR | Status: AC
  Filled 2018-12-13: qty 10

## 2018-12-13 MED ORDER — FAMOTIDINE 20 MG PO TABS
20.0000 mg | ORAL_TABLET | Freq: Once | ORAL | Status: AC
  Administered 2018-12-13: 20 mg via ORAL

## 2018-12-13 MED ORDER — HYDROCODONE-ACETAMINOPHEN 5-325 MG PO TABS: 1.0000 | ORAL_TABLET | Freq: Four times a day (QID) | ORAL | 0 refills | Status: AC | PRN

## 2018-12-13 MED ORDER — SODIUM CHLORIDE 0.9 % IV SOLN
INTRAVENOUS | Status: DC
  Administered 2018-12-13: 07:00:00 via INTRAVENOUS

## 2018-12-13 MED ORDER — ONDANSETRON HCL 4 MG/2ML IJ SOLN
INTRAMUSCULAR | Status: DC | PRN
  Administered 2018-12-13: 4 mg via INTRAVENOUS

## 2018-12-13 MED ORDER — BUPIVACAINE-EPINEPHRINE (PF) 0.5% -1:200000 IJ SOLN
INTRAMUSCULAR | Status: DC | PRN
  Administered 2018-12-13: 4 mL

## 2018-12-13 MED ORDER — SODIUM CHLORIDE 0.9 % IR SOLN
Status: DC | PRN
  Administered 2018-12-13: 1000 mL

## 2018-12-13 MED ORDER — SUGAMMADEX SODIUM 200 MG/2ML IV SOLN
INTRAVENOUS | Status: AC
  Filled 2018-12-13: qty 2

## 2018-12-13 MED ORDER — SUCCINYLCHOLINE CHLORIDE 20 MG/ML IJ SOLN
INTRAMUSCULAR | Status: AC
  Filled 2018-12-13: qty 1

## 2018-12-13 MED ORDER — CEFAZOLIN (ANCEF) 1 G IV SOLR: 1.0000 g | INTRAVENOUS | Status: DC

## 2018-12-13 MED ORDER — PROPOFOL 10 MG/ML IV BOLUS
INTRAVENOUS | Status: AC
  Filled 2018-12-13: qty 40

## 2018-12-13 MED ORDER — DEXTROSE 50 % IV SOLN
INTRAVENOUS | Status: AC
  Administered 2018-12-13: 25 mL
  Filled 2018-12-13: qty 50

## 2018-12-13 MED ORDER — MIDAZOLAM HCL 2 MG/2ML IJ SOLN
INTRAMUSCULAR | Status: DC | PRN
  Administered 2018-12-13: 2 mg via INTRAVENOUS

## 2018-12-13 MED ORDER — DEXTROSE 50 % IV SOLN: 25.0000 mL | Freq: Once | INTRAVENOUS | Status: DC

## 2018-12-13 MED ORDER — SODIUM CHLORIDE 0.9 % IV SOLN
INTRAVENOUS | Status: DC | PRN
  Administered 2018-12-13: 40 mL

## 2018-12-13 MED ORDER — PROPOFOL 10 MG/ML IV BOLUS
INTRAVENOUS | Status: DC | PRN
  Administered 2018-12-13: 150 mg via INTRAVENOUS

## 2018-12-13 MED ORDER — VASOPRESSIN 20 UNIT/ML IV SOLN
INTRAVENOUS | Status: DC | PRN
  Administered 2018-12-13: 1 [IU] via INTRAVENOUS

## 2018-12-13 MED ORDER — VASOPRESSIN 20 UNIT/ML IV SOLN
INTRAVENOUS | Status: AC
  Filled 2018-12-13: qty 1

## 2018-12-13 MED ORDER — DEXAMETHASONE SODIUM PHOSPHATE 10 MG/ML IJ SOLN
INTRAMUSCULAR | Status: AC
  Filled 2018-12-13: qty 1

## 2018-12-13 MED ORDER — FAMOTIDINE 20 MG PO TABS
ORAL_TABLET | ORAL | Status: AC
  Filled 2018-12-13: qty 1

## 2018-12-13 MED ORDER — SODIUM CHLORIDE 0.9 % IV SOLN
INTRAVENOUS | Status: DC | PRN
  Administered 2018-12-13: 30 ug/min via INTRAVENOUS

## 2018-12-13 MED ORDER — THROMBIN 5000 UNITS EX SOLR
CUTANEOUS | Status: AC
  Filled 2018-12-13: qty 5000

## 2018-12-13 MED ORDER — MIDAZOLAM HCL 2 MG/2ML IJ SOLN
INTRAMUSCULAR | Status: AC
  Filled 2018-12-13: qty 2

## 2018-12-13 MED ORDER — ROCURONIUM BROMIDE 50 MG/5ML IV SOLN
INTRAVENOUS | Status: AC
  Filled 2018-12-13: qty 1

## 2018-12-13 MED ORDER — SUCCINYLCHOLINE CHLORIDE 20 MG/ML IJ SOLN
INTRAMUSCULAR | Status: DC | PRN
  Administered 2018-12-13: 100 mg via INTRAVENOUS

## 2018-12-13 MED ORDER — SODIUM CHLORIDE FLUSH 0.9 % IV SOLN
INTRAVENOUS | Status: AC
  Filled 2018-12-13: qty 10

## 2018-12-13 MED ORDER — BUPIVACAINE HCL 0.5 % IJ SOLN
INTRAMUSCULAR | Status: DC | PRN
  Administered 2018-12-13: 20 mL

## 2018-12-13 MED ORDER — DEXAMETHASONE SODIUM PHOSPHATE 10 MG/ML IJ SOLN
INTRAMUSCULAR | Status: DC | PRN
  Administered 2018-12-13: 5 mg via INTRAVENOUS

## 2018-12-13 MED ORDER — ONDANSETRON HCL 4 MG/2ML IJ SOLN: 4.0000 mg | Freq: Once | INTRAMUSCULAR | Status: DC | PRN

## 2018-12-13 MED ORDER — LIDOCAINE HCL (CARDIAC) PF 100 MG/5ML IV SOSY
PREFILLED_SYRINGE | INTRAVENOUS | Status: DC | PRN
  Administered 2018-12-13: 100 mg via INTRAVENOUS

## 2018-12-13 MED ORDER — FENTANYL CITRATE (PF) 100 MCG/2ML IJ SOLN: 25.0000 ug | INTRAMUSCULAR | Status: DC | PRN

## 2018-12-13 MED ORDER — BUPIVACAINE LIPOSOME 1.3 % IJ SUSP
INTRAMUSCULAR | Status: AC
  Filled 2018-12-13: qty 20

## 2018-12-13 MED ORDER — BUPIVACAINE-EPINEPHRINE (PF) 0.5% -1:200000 IJ SOLN
INTRAMUSCULAR | Status: AC
  Filled 2018-12-13: qty 30

## 2018-12-13 MED ORDER — THROMBIN 5000 UNITS EX SOLR
CUTANEOUS | Status: DC | PRN
  Administered 2018-12-13: 5000 [IU] via TOPICAL

## 2018-12-13 MED ORDER — PHENYLEPHRINE HCL 10 MG/ML IJ SOLN
INTRAMUSCULAR | Status: AC
  Filled 2018-12-13: qty 1

## 2018-12-13 MED ORDER — ROCURONIUM BROMIDE 100 MG/10ML IV SOLN
INTRAVENOUS | Status: DC | PRN
  Administered 2018-12-13: 5 mg via INTRAVENOUS
  Administered 2018-12-13: 15 mg via INTRAVENOUS

## 2018-12-13 MED ORDER — DEXTROSE 50 % IV SOLN
25.0000 mL | Freq: Once | INTRAVENOUS | Status: AC
  Administered 2018-12-13: 25 mL via INTRAVENOUS

## 2018-12-13 MED ORDER — FENTANYL CITRATE (PF) 100 MCG/2ML IJ SOLN
INTRAMUSCULAR | Status: AC
  Filled 2018-12-13: qty 2

## 2018-12-13 MED ORDER — ONDANSETRON HCL 4 MG/2ML IJ SOLN
INTRAMUSCULAR | Status: AC
  Filled 2018-12-13: qty 2

## 2018-12-13 MED ORDER — DEXTROSE 50 % IV SOLN
INTRAVENOUS | Status: AC
  Filled 2018-12-13: qty 50

## 2018-12-13 MED ORDER — EPHEDRINE SULFATE 50 MG/ML IJ SOLN
INTRAMUSCULAR | Status: AC
  Filled 2018-12-13: qty 1

## 2018-12-13 MED ORDER — PHENYLEPHRINE HCL 10 MG/ML IJ SOLN
INTRAMUSCULAR | Status: DC | PRN
  Administered 2018-12-13 (×4): 100 ug via INTRAVENOUS
  Administered 2018-12-13 (×2): 200 ug via INTRAVENOUS
  Administered 2018-12-13 (×2): 100 ug via INTRAVENOUS

## 2018-12-13 MED ORDER — SUGAMMADEX SODIUM 200 MG/2ML IV SOLN
INTRAVENOUS | Status: DC | PRN
  Administered 2018-12-13: 199.6 mg via INTRAVENOUS

## 2018-12-13 MED ORDER — BUPIVACAINE HCL (PF) 0.5 % IJ SOLN
INTRAMUSCULAR | Status: AC
  Filled 2018-12-13: qty 30

## 2018-12-13 MED ORDER — FENTANYL CITRATE (PF) 100 MCG/2ML IJ SOLN
INTRAMUSCULAR | Status: DC | PRN
  Administered 2018-12-13 (×3): 50 ug via INTRAVENOUS

## 2018-12-13 MED ORDER — METHYLPREDNISOLONE ACETATE 40 MG/ML IJ SUSP
INTRAMUSCULAR | Status: AC
  Filled 2018-12-13: qty 1

## 2018-12-13 MED ORDER — PROPOFOL 10 MG/ML IV BOLUS
INTRAVENOUS | Status: AC
  Filled 2018-12-13: qty 20

## 2018-12-13 MED ORDER — VANCOMYCIN HCL 10 G IV SOLR
1500.0000 mg | Freq: Once | INTRAVENOUS | Status: AC
  Administered 2018-12-13: 750 mg via INTRAVENOUS
  Filled 2018-12-13: qty 1500

## 2018-12-13 MED ORDER — LACTATED RINGERS IV SOLN
INTRAVENOUS | Status: DC | PRN
  Administered 2018-12-13: 13:00:00 via INTRAVENOUS

## 2018-12-13 MED ORDER — METHYLPREDNISOLONE ACETATE 40 MG/ML IJ SUSP
INTRAMUSCULAR | Status: DC | PRN
  Administered 2018-12-13: 40 mg

## 2018-12-13 MED ORDER — TIZANIDINE HCL 4 MG PO TABS: 4.0000 mg | ORAL_TABLET | Freq: Four times a day (QID) | ORAL | 0 refills | Status: AC | PRN

## 2018-12-13 SURGICAL SUPPLY — 57 items
BUR NEURO DRILL SOFT 3.0X3.8M (BURR) ×3 IMPLANT
CANISTER SUCT 1200ML W/VALVE (MISCELLANEOUS) ×6 IMPLANT
CHLORAPREP W/TINT 26ML (MISCELLANEOUS) ×6 IMPLANT
CNTNR SPEC 2.5X3XGRAD LEK (MISCELLANEOUS) ×1
CONT SPEC 4OZ STER OR WHT (MISCELLANEOUS) ×2
CONTAINER SPEC 2.5X3XGRAD LEK (MISCELLANEOUS) ×1 IMPLANT
COUNTER NEEDLE 20/40 LG (NEEDLE) ×3 IMPLANT
COVER LIGHT HANDLE STERIS (MISCELLANEOUS) ×6 IMPLANT
COVER WAND RF STERILE (DRAPES) ×3 IMPLANT
CUP MEDICINE 2OZ PLAST GRAD ST (MISCELLANEOUS) ×6 IMPLANT
DERMABOND ADVANCED (GAUZE/BANDAGES/DRESSINGS) ×2
DERMABOND ADVANCED .7 DNX12 (GAUZE/BANDAGES/DRESSINGS) ×1 IMPLANT
DRAPE C-ARM 42X72 X-RAY (DRAPES) ×6 IMPLANT
DRAPE LAPAROTOMY 100X77 ABD (DRAPES) ×3 IMPLANT
DRAPE MICROSCOPE SPINE 48X150 (DRAPES) ×3 IMPLANT
DRAPE POUCH INSTRU U-SHP 10X18 (DRAPES) ×3 IMPLANT
DRAPE SURG 17X11 SM STRL (DRAPES) ×12 IMPLANT
ELECT CAUTERY BLADE TIP 2.5 (TIP) ×3
ELECT EZSTD 165MM 6.5IN (MISCELLANEOUS)
ELECT REM PT RETURN 9FT ADLT (ELECTROSURGICAL) ×3
ELECTRODE CAUTERY BLDE TIP 2.5 (TIP) ×1 IMPLANT
ELECTRODE EZSTD 165MM 6.5IN (MISCELLANEOUS) IMPLANT
ELECTRODE REM PT RTRN 9FT ADLT (ELECTROSURGICAL) ×1 IMPLANT
FRAME EYE SHIELD (PROTECTIVE WEAR) ×6 IMPLANT
GLOVE BIOGEL PI IND STRL 7.0 (GLOVE) ×1 IMPLANT
GLOVE BIOGEL PI INDICATOR 7.0 (GLOVE) ×2
GLOVE SURG SYN 7.0 (GLOVE) ×6 IMPLANT
GLOVE SURG SYN 7.0 PF PI (GLOVE) ×2 IMPLANT
GLOVE SURG SYN 8.5  E (GLOVE) ×6
GLOVE SURG SYN 8.5 E (GLOVE) ×3 IMPLANT
GLOVE SURG SYN 8.5 PF PI (GLOVE) ×3 IMPLANT
GOWN SRG XL LVL 3 NONREINFORCE (GOWNS) ×1 IMPLANT
GOWN STRL NON-REIN TWL XL LVL3 (GOWNS) ×2
GOWN STRL REUS W/TWL MED LVL3 (GOWN DISPOSABLE) ×3 IMPLANT
GRADUATE 1200CC STRL 31836 (MISCELLANEOUS) ×3 IMPLANT
KIT SPINAL PRONEVIEW (KITS) ×3 IMPLANT
KNIFE BAYONET SHORT DISCETOMY (MISCELLANEOUS) IMPLANT
MARKER SKIN DUAL TIP RULER LAB (MISCELLANEOUS) ×3 IMPLANT
NDL SAFETY ECLIPSE 18X1.5 (NEEDLE) ×1 IMPLANT
NEEDLE HYPO 18GX1.5 SHARP (NEEDLE) ×2
NEEDLE HYPO 22GX1.5 SAFETY (NEEDLE) ×3 IMPLANT
NS IRRIG 1000ML POUR BTL (IV SOLUTION) ×3 IMPLANT
PACK LAMINECTOMY NEURO (CUSTOM PROCEDURE TRAY) ×3 IMPLANT
PAD ARMBOARD 7.5X6 YLW CONV (MISCELLANEOUS) ×3 IMPLANT
SPOGE SURGIFLO 8M (HEMOSTASIS) ×2
SPONGE SURGIFLO 8M (HEMOSTASIS) ×1 IMPLANT
SUT DVC VLOC 3-0 CL 6 P-12 (SUTURE) ×3 IMPLANT
SUT VIC AB 0 CT1 27 (SUTURE) ×2
SUT VIC AB 0 CT1 27XCR 8 STRN (SUTURE) ×1 IMPLANT
SUT VIC AB 2-0 CT1 18 (SUTURE) ×3 IMPLANT
SYR 10ML LL (SYRINGE) ×3 IMPLANT
SYR 20CC LL (SYRINGE) ×3 IMPLANT
SYR 30ML LL (SYRINGE) ×6 IMPLANT
SYR 3ML LL SCALE MARK (SYRINGE) ×3 IMPLANT
TOWEL OR 17X26 4PK STRL BLUE (TOWEL DISPOSABLE) ×9 IMPLANT
TUBING CONNECTING 10 (TUBING) ×2 IMPLANT
TUBING CONNECTING 10' (TUBING) ×1

## 2018-12-13 NOTE — Progress Notes (Addendum)
Inpatient Diabetes Program Recommendations  AACE/ADA: New Consensus Statement on Inpatient Glycemic Control (2015)  Target Ranges:  Prepandial:   less than 140 mg/dL      Peak postprandial:   less than 180 mg/dL (1-2 hours)      Critically ill patients:  140 - 180 mg/dL   Lab Results  Component Value Date   GLUCAP 67 (L) 12/13/2018   HGBA1C 6.7 (H) 02/19/2013    Diabetes history: Type 2 DM per Endocrinology Outpatient Diabetes medications:  Per Dr.Solum on visit 11/23/18- "Type 2 diabetes. He is using a MiniMed Medtronic 530G insulin pump. Insulin pump and One Touch glucometer were downloaded and reviewed. - Uses U500 insulin Basal settings 12 am 0.5 units/hr 5:30 am 0.7 units/hr  9:30 pm 0.5 units/hr 24-hr basal is 15 syringe units of U-500 insulin   Bolus settings IC ratio 12 am 1:5, 12 pm 1:7  Sensitivity 12 am 35 Target 110-110 Active insulin time 5 hours" Inpatient Diabetes Program Recommendations:     Per PAT instructions- patient removed insulin pump prior to coming to hospital for surgery.  Once he is awake and able, insulin pump may be resumed.  Per RN, patient did not bring insulin pump with him to the hospital.  Please consider monitoring blood sugars every 1-2 hours.  Discussed with RN. Will follow.   Thanks  Adah Perl, RN, BC-ADM Inpatient Diabetes Coordinator Pager [phone] (8a-5p)

## 2018-12-13 NOTE — Discharge Summary (Signed)
Procedure: L4-5 lumbar decompression Procedure date: 12/13/2018 Diagnosis: neurogenic claudication  History: Stephen Harmon is POD0 s/p L4-5 lumbar decompression for neurogenic claudication. He is recovering well. Unable to determine if symptoms prior to surgery have resolved, since they presented while weight bearing. Denies any lower extremity pain/numbness/tingling at this time.   Physical Exam: Vitals:   12/13/18 0621  BP: (!) 148/75  Pulse: 80  Resp: 18  Temp: (!) 97.5 F (36.4 C)  SpO2: 98%   Strength:5/5 throughout lower extremities Sensation: intact and symmetric throughout lower extremities.   Data:  Recent Labs  Lab 12/07/18 0858  NA 136  K 4.4  CL 106  CO2 21*  BUN 36*  CREATININE 1.92*  GLUCOSE 177*  CALCIUM 10.2   No results for input(s): AST, ALT, ALKPHOS in the last 168 hours.  Invalid input(s): TBILI   Recent Labs  Lab 12/07/18 0858  WBC 12.1*  HGB 12.4*  HCT 38.3*  PLT 418*   Recent Labs  Lab 12/07/18 0858  APTT 32  INR 0.89         Other tests/results: No imaging reviewed  Assessment/Plan:  Stephen Harmon is POD0 s/p lumbar decompression for neurogenic claudication. Recovering well. Neuro intact. Will continue pain control with tylenol, norco, and tizanidine as needed. He is scheduled to follow up in clinic in 2 weeks to monitor progress.   Marin Olp PA-C Department of Neurosurgery

## 2018-12-13 NOTE — Discharge Instructions (Addendum)
°Your surgeon has performed an operation on your lumbar spine (low back) to relieve pressure on one or more nerves. Many times, patients feel better immediately after surgery and can “overdo it.” Even if you feel well, it is important that you follow these activity guidelines. If you do not let your back heal properly from the surgery, you can increase the chance of a disc herniation and/or return of your symptoms. The following are instructions to help in your recovery once you have been discharged from the hospital. ° °* Do not take anti-inflammatory medications for 3 days after surgery (naproxen [Aleve], ibuprofen [Advil, Motrin], celecoxib [Celebrex], etc.) ° °Activity  °  °No bending, lifting, or twisting (“BLT”). Avoid lifting objects heavier than 10 pounds (gallon milk jug).  Where possible, avoid household activities that involve lifting, bending, pushing, or pulling such as laundry, vacuuming, grocery shopping, and childcare. Try to arrange for help from friends and family for these activities while your back heals. ° °Increase physical activity slowly as tolerated.  Taking short walks is encouraged, but avoid strenuous exercise. Do not jog, run, bicycle, lift weights, or participate in any other exercises unless specifically allowed by your doctor. Avoid prolonged sitting, including car rides. ° °Talk to your doctor before resuming sexual activity. ° °You should not drive until cleared by your doctor. ° °Until released by your doctor, you should not return to work or school.  You should rest at home and let your body heal.  ° °You may shower two days after your surgery.  After showering, lightly dab your incision dry. Do not take a tub bath or go swimming for 3 weeks, or until approved by your doctor at your follow-up appointment. ° °If you smoke, we strongly recommend that you quit.  Smoking has been proven to interfere with normal healing in your back and will dramatically reduce the success rate of  your surgery. Please contact [facility] (800-QUIT-NOW) and use the resources at www.[facility].com for assistance in stopping smoking. ° °Surgical Incision °  °If you have a dressing on your incision, you may remove it three days after your surgery. Keep your incision area clean and dry. ° °If you have staples or stitches on your incision, you should have a follow up scheduled for removal. If you do not have staples or stitches, you will have steri-strips (small pieces of surgical tape) or Dermabond glue. The steri-strips/glue should begin to peel away within about a week (it is fine if the steri-strips fall off before then). If the strips are still in place one week after your surgery, you may gently remove them. ° °Diet          ° ° You may return to your usual diet. Be sure to stay hydrated. ° °When to Contact Us ° °Although your surgery and recovery will likely be uneventful, you may have some residual numbness, aches, and pains in your back and/or legs. This is normal and should improve in the next few weeks. ° °However, should you experience any of the following, contact us immediately: °• New numbness or weakness °• Pain that is progressively getting worse, and is not relieved by your pain medications or rest °• Bleeding, redness, swelling, pain, or drainage from surgical incision °• Chills or flu-like symptoms °• Fever greater than 101.0 F (38.3 C) °• Problems with bowel or bladder functions °• Difficulty breathing or shortness of breath °• Warmth, tenderness, or swelling in your calf ° °Contact Information °• During office hours (Monday-Friday   9 am to 5 pm), please call your physician at [phone] °• After hours and weekends, please call the [facility] Operator at [phone] and ask for the Neurosurgery Resident On Call  °• For a life-threatening emergency, call 911 ° °AMBULATORY SURGERY  °DISCHARGE INSTRUCTIONS ° ° °1) The drugs that you were given will stay in your system until tomorrow so for the next 24  hours you should not: ° °A) Drive an automobile °B) Make any legal decisions °C) Drink any alcoholic beverage ° ° °2) You may resume regular meals tomorrow.  Today it is better to start with liquids and gradually work up to solid foods. ° °You may eat anything you prefer, but it is better to start with liquids, then soup and crackers, and gradually work up to solid foods. ° ° °3) Please notify your doctor immediately if you have any unusual bleeding, trouble breathing, redness and pain at the surgery site, drainage, fever, or pain not relieved by medication. ° ° ° °4) Additional Instructions: ° °Please contact your physician with any problems or Same Day Surgery at [phone], Monday through Friday 6 am to 4 pm, or [place] at [place] Main number at [phone]. ° °

## 2018-12-13 NOTE — Anesthesia Procedure Notes (Signed)
Procedure Name: Intubation Date/Time: 12/13/2018 11:11 AM Performed by: Hedda Slade, CRNA Pre-anesthesia Checklist: Patient identified, Patient being monitored, Timeout performed, Emergency Drugs available and Suction available Patient Re-evaluated:Patient Re-evaluated prior to induction Oxygen Delivery Method: Circle system utilized Preoxygenation: Pre-oxygenation with 100% oxygen Induction Type: IV induction Ventilation: Mask ventilation without difficulty Laryngoscope Size: 3 and McGraph Grade View: Grade I Tube type: Oral Tube size: 7.5 mm Number of attempts: 1 Airway Equipment and Method: Stylet Placement Confirmation: ETT inserted through vocal cords under direct vision,  positive ETCO2 and breath sounds checked- equal and bilateral Secured at: 21 cm Tube secured with: Tape Dental Injury: Teeth and Oropharynx as per pre-operative assessment

## 2018-12-13 NOTE — Anesthesia Post-op Follow-up Note (Signed)
Anesthesia QCDR form completed.        

## 2018-12-13 NOTE — Anesthesia Preprocedure Evaluation (Addendum)
Anesthesia Evaluation  Patient identified by MRN, date of birth, ID band Patient awake    Reviewed: Allergy & Precautions, NPO status , Patient's Chart, lab work & pertinent test results  History of Anesthesia Complications Negative for: history of anesthetic complications  Airway Mallampati: IV  TM Distance: >3 FB Neck ROM: Full    Dental no notable dental hx.    Pulmonary neg pulmonary ROS,    Pulmonary exam normal breath sounds clear to auscultation       Cardiovascular Exercise Tolerance: Good hypertension, +CHF (2/2 chemo)  Normal cardiovascular exam Rhythm:Regular Rate:Normal  Dilated CM  Myocardial perfusion 03/19/16:  There was no ST segment deviation noted during stress. Defect 1: There is a small defect of mild severity present in the mid inferior location. Findings consistent with prior myocardial infarction. This is a low risk study. The left ventricular ejection fraction is normal (55-65%).   Neuro/Psych negative neurological ROS     GI/Hepatic negative GI ROS, Neg liver ROS,   Endo/Other  diabetes, Type 2, Insulin DependentHypothyroidism   Renal/GU Renal disease (stage III CKD)  negative genitourinary   Musculoskeletal   Abdominal   Peds negative pediatric ROS (+)  Hematology negative hematology ROS (+) Hodgkin lymphoma   Anesthesia Other Findings Past Medical History: No date: Cancer ([place])     Comment:  Hodgkin's Lymphoma in the past No date: CHF (congestive heart failure) (HCC) No date: Diabetes mellitus without complication (HCC)     Comment:  type 2 No date: Hodgkin's lymphoma (HCC) No date: Hyperlipidemia No date: Thyroid disease  Stress test was within normal per Dr. Nehemiah Massed       Hx of EF 35%  Reproductive/Obstetrics                            Anesthesia Physical  Anesthesia Plan  ASA: III  Anesthesia Plan: General   Post-op Pain Management:     Induction: Intravenous  PONV Risk Score and Plan:   Airway Management Planned: Oral ETT  Additional Equipment:   Intra-op Plan:   Post-operative Plan: Extubation in OR  Informed Consent: I have reviewed the patients History and Physical, chart, labs and discussed the procedure including the risks, benefits and alternatives for the proposed anesthesia with the patient or authorized representative who has indicated his/her understanding and acceptance.     Dental advisory given  Plan Discussed with: CRNA and Surgeon  Anesthesia Plan Comments:         Anesthesia Quick Evaluation

## 2018-12-13 NOTE — Progress Notes (Signed)
Vancomycin 1500 mg x1 (~15 mg/kg) ordered for surgical prophylaxis per consult.  Sim Boast, PharmD, BCPS  12/13/18 5:58 AM

## 2018-12-13 NOTE — H&P (Signed)
I have reviewed and confirmed my history and physical from 11/30/2018 with no additions or changes. Plan for L4-5 lumbar decompression.  Risks and benefits reviewed.  Heart sounds normal no MRG. Chest Clear to Auscultation Bilaterally.

## 2018-12-13 NOTE — Op Note (Signed)
Indications: Mr. Blahnik is a 58 yo male who presented with neurogenic claudication primarily impacting his left side.  After failing conservative management, he elected for surgical treatment.  Findings: synovial cyst off left L4-5 joint  Preoperative Diagnosis: Lumbar Stenosis with neurogenic claudication Postoperative Diagnosis: same   EBL: 10 ml IVF: 1000 ml Drains: none Disposition: Extubated and Stable to PACU Complications: none  No foley catheter was placed.   Preoperative Note:   Risks of surgery discussed include: infection, bleeding, stroke, coma, death, paralysis, CSF leak, nerve/spinal cord injury, numbness, tingling, weakness, complex regional pain syndrome, recurrent stenosis and/or disc herniation, vascular injury, development of instability, neck/back pain, need for further surgery, persistent symptoms, development of deformity, and the risks of anesthesia. The patient understood these risks and agreed to proceed.  Operative Note:   1. L4-5 lumbar decompression including central laminectomy and bilateral medial facetectomies including foraminotomies  The patient was then brought from the preoperative center with intravenous access established.  The patient underwent general anesthesia and endotracheal tube intubation, and was then rotated on the [place] rail top where all pressure points were appropriately padded.  The skin was then thoroughly cleansed.  Perioperative antibiotic prophylaxis was administered.  Sterile prep and drapes were then applied and a timeout was then observed.  C-arm was brought into the field under sterile conditions and under lateral visualization the L4-5 interspace was identified and marked.  The incision was marked on the left and injected with local anesthetic. Once this was complete a 2 cm incision was opened with the use of a #10 blade knife.    The metrx tubes were sequentially advanced and confirmed in position at L4-5. An 47mm by 25mm  tube was locked in place to the bed side attachment.  The microscope was then sterilely brought into the field and muscle creep was hemostased with a bipolar and resected with a pituitary rongeur.  A Bovie extender was then used to expose the spinous process and lamina.  Careful attention was placed to not violate the facet capsule. A 3 mm matchstick drill bit was then used to make a hemi-laminotomy trough until the ligamentum flavum was exposed.  This was extended to the base of the spinous process and to the contralateral side to remove all the central bone from each side.  Once this was complete and the underlying ligamentum flavum was visualized, it was dissected with a curette and resected with Kerrison rongeurs.  Extensive ligamentum hypertrophy was noted, requiring a substantial amount of time and care for removal.  The dura was identified and palpated. The kerrison rongeur was then used to remove the medial facet bilaterally until no compression was noted.  There was a synovial cyst noted on the left side, compressing the left L5 nerve root.  This was resected until the nerve was free. A balltip probe was used to confirm decompression of the ipsilateral L5 nerve root.  Additional attention was paid to completion of the contralateral L4-5 foraminotomy until the contralateral traversing nerve root was completely free.  Once this was complete, L4-5 central decompression including medial facetectomy and foraminotomy was confirmed and decompression on both sides was confirmed. No CSF leak was noted.  A Depo-Medrol soaked Gelfoam pledget was placed in the defect.  The wound was copiously irrigated. The tube system was then removed under microscopic visualization and hemostasis was obtained with a bipolar.    The fascial layer was reapproximated with the use of a 0 Vicryl suture.  Subcutaneous tissue layer  was reapproximated using 2-0 Vicryl suture.  3-0 monocryl was placed in subcuticular fashion. The skin  was then cleansed and Dermabond was used to close the skin opening.  Patient was then rotated back to the preoperative bed awakened from anesthesia and taken to recovery all counts are correct in this case.  I performed the entire procedure with the assistance of Marin Olp PA as an Pensions consultant.  Nayib Remer K. Izora Ribas MD

## 2018-12-13 NOTE — Anesthesia Postprocedure Evaluation (Signed)
Anesthesia Post Note  Patient: Stephen Harmon  Procedure(s) Performed: LUMBAR LAMINECTOMY/DECOMPRESSION MICRODISCECTOMY 1 LEVEL L4-5 (N/A Back)  Patient location during evaluation: PACU Anesthesia Type: General Level of consciousness: awake and alert and oriented Pain management: pain level controlled Vital Signs Assessment: post-procedure vital signs reviewed and stable Respiratory status: spontaneous breathing, nonlabored ventilation and respiratory function stable Cardiovascular status: blood pressure returned to baseline and stable Postop Assessment: no signs of nausea or vomiting Anesthetic complications: no     Last Vitals:  Vitals:   12/13/18 1410 12/13/18 1420  BP: 123/65 131/66  Pulse: 84 85  Resp: 20 16  Temp:  (!) 36.2 C  SpO2: 92% 94%    Last Pain:  Vitals:   12/13/18 1420  TempSrc: Temporal  PainSc: 0-No pain                 Reve Crocket

## 2018-12-13 NOTE — Transfer of Care (Signed)
Immediate Anesthesia Transfer of Care Note  Patient: Stephen Harmon  Procedure(s) Performed: LUMBAR LAMINECTOMY/DECOMPRESSION MICRODISCECTOMY 1 LEVEL L4-5 (N/A Back)  Patient Location: PACU  Anesthesia Type:General  Level of Consciousness: sedated  Airway & Oxygen Therapy: Patient Spontanous Breathing and Patient connected to face mask oxygen  Post-op Assessment: Report given to RN and Post -op Vital signs reviewed and stable  Post vital signs: Reviewed and stable  Last Vitals:  Vitals Value Taken Time  BP 134/75 12/13/2018  1:17 PM  Temp 36.5 C 12/13/2018  1:17 PM  Pulse 84 12/13/2018  1:20 PM  Resp 14 12/13/2018  1:20 PM  SpO2 99 % 12/13/2018  1:20 PM  Vitals shown include unvalidated device data.  Last Pain:  Vitals:   12/13/18 1317  TempSrc: Temporal  PainSc:          Complications: No apparent anesthesia complications

## 2018-12-14 ENCOUNTER — Encounter: Payer: Self-pay | Admitting: Neurosurgery

## 2018-12-18 ENCOUNTER — Ambulatory Visit: Payer: Self-pay | Admitting: Gastroenterology

## 2019-01-02 ENCOUNTER — Other Ambulatory Visit: Payer: Self-pay | Admitting: Gastroenterology

## 2019-01-02 DIAGNOSIS — K580 Irritable bowel syndrome with diarrhea: Secondary | ICD-10-CM

## 2019-01-03 MED ORDER — PANCRELIPASE (LIP-PROT-AMYL) 36000-114000 UNITS PO CPEP: 72000.0000 [IU] | ORAL_CAPSULE | Freq: Three times a day (TID) | ORAL | 0 refills | Status: AC

## 2019-01-03 MED ORDER — AMITRIPTYLINE HCL 50 MG PO TABS: 50.0000 mg | ORAL_TABLET | Freq: Every day | ORAL | 0 refills | Status: DC

## 2019-01-31 ENCOUNTER — Other Ambulatory Visit: Payer: Self-pay | Admitting: Gastroenterology

## 2019-02-14 ENCOUNTER — Encounter: Payer: Self-pay | Admitting: Gastroenterology

## 2019-02-15 ENCOUNTER — Other Ambulatory Visit: Payer: Self-pay

## 2019-02-15 DIAGNOSIS — R109 Unspecified abdominal pain: Secondary | ICD-10-CM

## 2019-02-16 ENCOUNTER — Telehealth: Payer: Self-pay | Admitting: Gastroenterology

## 2019-02-16 NOTE — Telephone Encounter (Signed)
l/m to call & schedule an appt for televisit 2-3wks per [facility] 02-14-19

## 2019-02-23 ENCOUNTER — Encounter: Payer: Self-pay | Admitting: Gastroenterology

## 2019-02-23 ENCOUNTER — Other Ambulatory Visit: Payer: Self-pay | Admitting: Gastroenterology

## 2019-02-26 ENCOUNTER — Encounter: Payer: Self-pay | Admitting: Gastroenterology

## 2019-02-26 ENCOUNTER — Ambulatory Visit (INDEPENDENT_AMBULATORY_CARE_PROVIDER_SITE_OTHER): Payer: Medicare Other | Admitting: Gastroenterology

## 2019-02-26 DIAGNOSIS — K580 Irritable bowel syndrome with diarrhea: Secondary | ICD-10-CM | POA: Diagnosis not present

## 2019-02-26 DIAGNOSIS — R109 Unspecified abdominal pain: Secondary | ICD-10-CM

## 2019-02-26 DIAGNOSIS — R140 Abdominal distension (gaseous): Secondary | ICD-10-CM

## 2019-02-26 MED ORDER — RIFAXIMIN 550 MG PO TABS: 550.0000 mg | ORAL_TABLET | Freq: Two times a day (BID) | ORAL | 0 refills | Status: AC

## 2019-02-26 MED ORDER — AMITRIPTYLINE HCL 50 MG PO TABS: 50.0000 mg | ORAL_TABLET | Freq: Every day | ORAL | 0 refills | Status: AC

## 2019-02-26 NOTE — Progress Notes (Addendum)
Stephen Darby, MD [address]  [place]  [place], [place] [address]  Main: [phone]  Fax: [phone] Pager: [phone]     Gastroenterology follow-up virtual Visit  Referring Provider:     No ref. provider found Primary Care Physician:  Patient, No Pcp Per Primary Gastroenterologist:  Dr. Cephas Harmon Reason for Consultation:     Abdominal pain        HPI:   Stephen Harmon is a 58 y.o. male referred by Dr. 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   I discussed the limitations, risks, security and privacy concerns of performing an evaluation and management service by telephone and the availability of in person appointments. I also discussed with the patient that there may be a patient responsible charge related to this service. The patient expressed understanding and agreed to proceed.  Location of the Patient: Home  Location of the provider: Home office   History of Present Illness: Mr. 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  He is currently on Creon for this.  He underwent back surgery in 11/2018.  And he recovered well from it.  The last 2 weeks, he has been experiencing left mid abdominal pain, cramps sometimes radiating to right abdomen associated with bloating.  He also noticed that his bowel frequency has decreased to once every other day and last 2 weeks.  He continues to take amitriptyline 50 mg at a time along with Creon.  He tried laxative which temporarily relieved the discomfort.  He denies fever, chills.  He sometimes reports nausea.  He denies rectal bleeding.  When he originally contacted me for abdominal pain,  with his history of lymphoma, I recommended CT abdomen and pelvis with contrast which is scheduled for end of April.  He has been eating only 2 meals a day due to discomfort.  The pain is usually worse after eating but more or less constant and disturbs his sleep.  Denies taking opioid medication  NSAIDs: None  Antiplts/Anticoagulants/Anti thrombotics: None   Past Medical History:  Diagnosis Date   Cancer (Stephen Harmon)    Hodgkin's Lymphoma in the past   CHF (congestive heart failure) (HCC)    Diabetes mellitus without complication (Stephen Harmon)    type 2   Hodgkin's lymphoma (Stephen Harmon)    Hyperlipidemia    Thyroid disease     Past Surgical History:  Procedure Laterality Date   BONE MARROW TRANSPLANT     CHOLECYSTECTOMY     COLONOSCOPY WITH PROPOFOL N/A 01/11/2018   Procedure: COLONOSCOPY WITH PROPOFOL;  Surgeon: Lin Landsman, MD;  Location: [facility];  Service: Endoscopy;  Laterality: N/A;   ESOPHAGOGASTRODUODENOSCOPY (EGD) WITH PROPOFOL N/A 01/11/2018   Procedure: ESOPHAGOGASTRODUODENOSCOPY (EGD) WITH PROPOFOL;  Surgeon: Lin Landsman, MD;  Location: [facility];  Service: Endoscopy;  Laterality: N/A;  diabetic-insulin pump   EXPLORATORY LAPAROTOMY     FOOT SURGERY Bilateral    HERNIA REPAIR Bilateral    both hernia repair surgeries as a child   LUMBAR LAMINECTOMY/DECOMPRESSION MICRODISCECTOMY N/A 12/13/2018   Procedure: LUMBAR LAMINECTOMY/DECOMPRESSION MICRODISCECTOMY 1 LEVEL L4-5;  Surgeon: Meade Maw, MD;  Location: [facility];  Service: Neurosurgery;  Laterality: N/A;   POLYPECTOMY  01/11/2018   Procedure: POLYPECTOMY;  Surgeon: Lin Landsman, MD;  Location: [facility];  Service: Endoscopy;;   SPLENECTOMY      Current Outpatient Medications:    carvedilol (COREG) 6.25 MG tablet, Take 6.25 mg by mouth 2 (two) times daily with a meal., Disp: , Rfl:    furosemide (LASIX) 20 MG tablet, Take 20 mg by mouth daily as needed for  edema., Disp: , Rfl:    glucose blood test strip, 1 each by Other route every 4 (four) hours. Use as instructed, Disp: , Rfl:    HYDROcodone-acetaminophen (NORCO/VICODIN) 5-325 MG tablet, Take 1 tablet by mouth every 6 (six) hours as needed (pain.)., Disp: 30 tablet, Rfl: 0   Insulin Human (INSULIN PUMP) SOLN, Inject into the skin. insulin regular human CONCENTRATED (HUMULIN R) 500 UNIT/ML injection, Disp: , Rfl:    levothyroxine (SYNTHROID, LEVOTHROID) 25 MCG tablet, Take 1 tablet (25 mcg total) by mouth daily before breakfast., Disp: 30 tablet, Rfl: 6   lipase/protease/amylase (CREON) 36000 UNITS CPEP capsule, Take 2 capsules (72,000 Units total) by mouth 3 (three) times daily with meals., Disp: 360 capsule, Rfl: 0   lisinopril (PRINIVIL,ZESTRIL) 2.5 MG tablet, Take 2.5 mg by mouth every evening. , Disp: , Rfl:    metFORMIN (GLUCOPHAGE-XR) 500 MG 24 hr tablet, Take 1,000 mg by mouth daily before supper. , Disp: , Rfl:    spironolactone (ALDACTONE) 25 MG tablet, Take 25 mg by mouth daily., Disp: , Rfl:    tiZANidine (ZANAFLEX) 4 MG tablet, Take 1 tablet (4 mg total) by mouth every 6 (six) hours as needed for muscle spasms., Disp: 40 tablet, Rfl: 0   amitriptyline (ELAVIL) 50 MG tablet, Take 1 tablet (50 mg total) by mouth at bedtime., Disp: 60 tablet, Rfl: 0   rifaximin (XIFAXAN) 550 MG TABS tablet, Take 1 tablet (550 mg total) by mouth 2 (two) times daily for 14 days., Disp: 28 tablet, Rfl: 0    Family History  Problem Relation Age of Onset   Heart disease Father    Diabetes Father      Social History   Tobacco Use   Smoking status: Never Smoker   Smokeless tobacco: Never Used  Substance Use Topics   Alcohol use: Yes    Alcohol/week: 0.0 standard drinks    Comment: wine occasionally   Drug use: No    Allergies as of 02/26/2019 - Review Complete 02/26/2019  Allergen Reaction Noted   Amoxicillin Hives 01/30/2016     Imaging Studies: Reviewed  Assessment and  Plan:   [name] is a 58 y.o. 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  He denies fever, chills, rectal bleeding.  His history is not consistent with acute diverticulitis.  Probably combination of recent surgery, amitriptyline and opioid use  Trial of rifaximin for 2 weeks for possible bacterial overgrowth Hold amitriptyline Trial of stool softener like MiraLAX and fiber supplement such as Benefiber or Citrucel  He also needs surveillance colonoscopy for history of multiple tubular adenomas which can be scheduled in next 2 to 3 months   Follow Up Instructions:   I discussed the assessment and treatment plan with the patient. The patient was provided an opportunity to ask questions and all were answered. The patient agreed with the plan  and demonstrated an understanding of the instructions.   The patient was advised to call back or seek an in-person evaluation if the symptoms worsen or if the condition fails to improve as anticipated.  I provided 15 minutes of non-face-to-face time during this encounter.   Follow up in 4 to 6 weeks   Stephen Darby, MD

## 2019-03-01 ENCOUNTER — Emergency Department: Payer: Medicare Other

## 2019-03-01 ENCOUNTER — Emergency Department
Admission: EM | Admit: 2019-03-01 | Discharge: 2019-03-01 | Disposition: A | Discharge status: Other Acute Inpatient Hospital | Payer: Medicare Other | Attending: Emergency Medicine | Admitting: Emergency Medicine

## 2019-03-01 ENCOUNTER — Encounter: Payer: Self-pay | Admitting: Intensive Care

## 2019-03-01 ENCOUNTER — Telehealth: Payer: Self-pay | Admitting: Gastroenterology

## 2019-03-01 ENCOUNTER — Other Ambulatory Visit: Payer: Self-pay

## 2019-03-01 ENCOUNTER — Encounter: Payer: Self-pay | Admitting: Gastroenterology

## 2019-03-01 DIAGNOSIS — E872 Acidosis, unspecified: Secondary | ICD-10-CM

## 2019-03-01 DIAGNOSIS — R1012 Left upper quadrant pain: Secondary | ICD-10-CM | POA: Diagnosis present

## 2019-03-01 DIAGNOSIS — N183 Chronic kidney disease, stage 3 unspecified: Secondary | ICD-10-CM

## 2019-03-01 DIAGNOSIS — E1122 Type 2 diabetes mellitus with diabetic chronic kidney disease: Secondary | ICD-10-CM | POA: Insufficient documentation

## 2019-03-01 DIAGNOSIS — I5022 Chronic systolic (congestive) heart failure: Secondary | ICD-10-CM | POA: Insufficient documentation

## 2019-03-01 DIAGNOSIS — Z8571 Personal history of Hodgkin lymphoma: Secondary | ICD-10-CM | POA: Insufficient documentation

## 2019-03-01 DIAGNOSIS — Z794 Long term (current) use of insulin: Secondary | ICD-10-CM | POA: Insufficient documentation

## 2019-03-01 DIAGNOSIS — K8591 Acute pancreatitis with uninfected necrosis, unspecified: Secondary | ICD-10-CM | POA: Diagnosis not present

## 2019-03-01 DIAGNOSIS — I130 Hypertensive heart and chronic kidney disease with heart failure and stage 1 through stage 4 chronic kidney disease, or unspecified chronic kidney disease: Secondary | ICD-10-CM | POA: Insufficient documentation

## 2019-03-01 LAB — COMPREHENSIVE METABOLIC PANEL
ALT: 22 U/L (ref 0–44)
AST: 20 U/L (ref 15–41)
Albumin: 3.8 g/dL (ref 3.5–5.0)
Alkaline Phosphatase: 91 U/L (ref 38–126)
Anion gap: 18 — ABNORMAL HIGH (ref 5–15)
BUN: 39 mg/dL — ABNORMAL HIGH (ref 6–20)
CO2: 18 mmol/L — ABNORMAL LOW (ref 22–32)
Calcium: 10.5 mg/dL — ABNORMAL HIGH (ref 8.9–10.3)
Chloride: 96 mmol/L — ABNORMAL LOW (ref 98–111)
Creatinine, Ser: 1.75 mg/dL — ABNORMAL HIGH (ref 0.61–1.24)
GFR calc Af Amer: 48 mL/min — ABNORMAL LOW (ref >60)
GFR calc non Af Amer: 41 mL/min — ABNORMAL LOW (ref >60)
Glucose, Bld: 245 mg/dL — ABNORMAL HIGH (ref 70–99)
Potassium: 5.6 mmol/L — ABNORMAL HIGH (ref 3.5–5.1)
Sodium: 132 mmol/L — ABNORMAL LOW (ref 135–145)
Total Bilirubin: 0.8 mg/dL (ref 0.3–1.2)
Total Protein: 7.6 g/dL (ref 6.5–8.1)

## 2019-03-01 LAB — CBC
HCT: 41.4 % (ref 39.0–52.0)
Hemoglobin: 13.9 g/dL (ref 13.0–17.0)
MCH: 31.7 pg (ref 26.0–34.0)
MCHC: 33.6 g/dL (ref 30.0–36.0)
MCV: 94.3 fL (ref 80.0–100.0)
Platelets: 613 10*3/uL — ABNORMAL HIGH (ref 150–400)
RBC: 4.39 MIL/uL (ref 4.22–5.81)
RDW: 13.2 % (ref 11.5–15.5)
WBC: 17.5 10*3/uL — ABNORMAL HIGH (ref 4.0–10.5)
nRBC: 0 % (ref 0.0–0.2)

## 2019-03-01 LAB — URINALYSIS, COMPLETE (UACMP) WITH MICROSCOPIC
Bacteria, UA: NONE SEEN
Bilirubin Urine: NEGATIVE
Glucose, UA: NEGATIVE mg/dL
Hgb urine dipstick: NEGATIVE
Ketones, ur: 5 mg/dL — AB
Leukocytes,Ua: NEGATIVE
Nitrite: NEGATIVE
Protein, ur: NEGATIVE mg/dL
Specific Gravity, Urine: 1.018 (ref 1.005–1.030)
pH: 5 (ref 5.0–8.0)

## 2019-03-01 LAB — PHOSPHORUS: Phosphorus: 3.9 mg/dL (ref 2.5–4.6)

## 2019-03-01 LAB — LACTIC ACID, PLASMA: Lactic Acid, Venous: 2 mmol/L (ref 0.5–1.9)

## 2019-03-01 LAB — MAGNESIUM: Magnesium: 1.3 mg/dL — ABNORMAL LOW (ref 1.7–2.4)

## 2019-03-01 LAB — LIPASE, BLOOD: Lipase: 15 U/L (ref 11–51)

## 2019-03-01 MED ORDER — MORPHINE SULFATE (PF) 4 MG/ML IV SOLN
4.0000 mg | Freq: Once | INTRAVENOUS | Status: AC
  Administered 2019-03-01: 4 mg via INTRAVENOUS
  Filled 2019-03-01: qty 1

## 2019-03-01 MED ORDER — PROMETHAZINE HCL 25 MG/ML IJ SOLN
12.5000 mg | Freq: Once | INTRAMUSCULAR | Status: AC
  Administered 2019-03-01: 12.5 mg via INTRAVENOUS
  Filled 2019-03-01: qty 1

## 2019-03-01 MED ORDER — ONDANSETRON HCL 4 MG/2ML IJ SOLN
4.0000 mg | Freq: Once | INTRAMUSCULAR | Status: AC
  Administered 2019-03-01: 4 mg via INTRAVENOUS
  Filled 2019-03-01: qty 2

## 2019-03-01 MED ORDER — METRONIDAZOLE IN NACL 5-0.79 MG/ML-% IV SOLN
500.0000 mg | Freq: Once | INTRAVENOUS | Status: AC
  Administered 2019-03-01: 500 mg via INTRAVENOUS
  Filled 2019-03-01: qty 100

## 2019-03-01 MED ORDER — MAGNESIUM SULFATE 2 GM/50ML IV SOLN
2.0000 g | INTRAVENOUS | Status: AC
  Administered 2019-03-01: 19:00:00 2 g via INTRAVENOUS
  Filled 2019-03-01: qty 50

## 2019-03-01 MED ORDER — SODIUM CHLORIDE 0.9 % IV BOLUS
1000.0000 mL | Freq: Once | INTRAVENOUS | Status: AC
  Administered 2019-03-01: 1000 mL via INTRAVENOUS

## 2019-03-01 MED ORDER — HYDROMORPHONE HCL 1 MG/ML IJ SOLN
1.0000 mg | Freq: Once | INTRAMUSCULAR | Status: AC
  Administered 2019-03-01: 1 mg via INTRAVENOUS
  Filled 2019-03-01: qty 1

## 2019-03-01 MED ORDER — SODIUM CHLORIDE 0.9% FLUSH
3.0000 mL | Freq: Once | INTRAVENOUS | Status: AC
  Administered 2019-03-01: 3 mL via INTRAVENOUS

## 2019-03-01 MED ORDER — IOHEXOL 300 MG/ML  SOLN
75.0000 mL | Freq: Once | INTRAMUSCULAR | Status: AC | PRN
  Administered 2019-03-01: 75 mL via INTRAVENOUS

## 2019-03-01 MED ORDER — SODIUM CHLORIDE 0.9 % IV SOLN
2.0000 g | Freq: Once | INTRAVENOUS | Status: AC
  Administered 2019-03-01: 2 g via INTRAVENOUS
  Filled 2019-03-01: qty 20

## 2019-03-01 NOTE — ED Notes (Signed)
Son to lobby asking for update. RN explained visitation policy and son was very understanding. Best update possible given by this RN after talking to MD.   Pandora Leiter requesting updates on transfer and treatment decisions.  Jordon Kristiansen - son - [phone]

## 2019-03-01 NOTE — ED Provider Notes (Addendum)
[facility] Emergency Department Provider Note  ____________________________________________  Time seen: Approximately 4:13 PM  I have reviewed the triage vital signs and the nursing notes.   HISTORY  Chief Complaint Abdominal Pain and Emesis    HPI Stephen Harmon is a 58 y.o. male with a history of Hodgkin's lymphoma status post treatment, CKD, CHF diabetes (on insulin pump at home), hyperlipidemia who comes the ED complaining of  upper abdominal pain, burning in nature, constant for a week, waxing and waning, worse with eating, no alleviating factors, with last 48 hours much more severe and associated with vomiting.  Nonradiating.  Denies chest pain shortness of breath fevers chills sweats or body aches.  No black or bloody stool, no hematemesis.     Past Medical History:  Diagnosis Date  . Cancer ([place])    Hodgkin's Lymphoma in the past  . CHF (congestive heart failure) ([place])   . Diabetes mellitus without complication ([place])    type 2  . Hodgkin's lymphoma ([place])   . Hyperlipidemia   . Thyroid disease      Patient Active Problem List   Diagnosis Date Noted  . Diarrhea   . Abdominal pain   . Chest pain at rest 03/18/2016  . Chest pain 03/18/2016  . Hodgkin lymphoma ([place]) 01/30/2016  . Diabetes mellitus ([place]) 01/30/2016  . Mixed hyperlipidemia 01/30/2016  . Edema leg 10/15/2015  . Breathlessness on exertion 10/01/2015  . Benign essential HTN 04/02/2015  . Chronic kidney disease (CKD), stage III (moderate) ([place]) 12/30/2014  . Acquired hypothyroidism 12/30/2014  . History of surgical procedure 12/30/2014  . Type 2 diabetes mellitus with other diabetic kidney complication ([place]) 73/71/0626  . Presence of insulin pump 12/30/2014  . Type II diabetes mellitus with renal manifestations, uncontrolled ([place]) 12/30/2014  . Chronic systolic heart failure ([place]) 10/03/2014     Past Surgical History:  Procedure Laterality Date  . BONE MARROW TRANSPLANT     . CHOLECYSTECTOMY    . COLONOSCOPY WITH PROPOFOL N/A 01/11/2018   Procedure: COLONOSCOPY WITH PROPOFOL;  Surgeon: Lin Landsman, MD;  Location: [place];  Service: Endoscopy;  Laterality: N/A;  . ESOPHAGOGASTRODUODENOSCOPY (EGD) WITH PROPOFOL N/A 01/11/2018   Procedure: ESOPHAGOGASTRODUODENOSCOPY (EGD) WITH PROPOFOL;  Surgeon: Lin Landsman, MD;  Location: [place];  Service: Endoscopy;  Laterality: N/A;  diabetic-insulin pump  . EXPLORATORY LAPAROTOMY    . FOOT SURGERY Bilateral   . HERNIA REPAIR Bilateral    both hernia repair surgeries as a child  . LUMBAR LAMINECTOMY/DECOMPRESSION MICRODISCECTOMY N/A 12/13/2018   Procedure: LUMBAR LAMINECTOMY/DECOMPRESSION MICRODISCECTOMY 1 LEVEL L4-5;  Surgeon: Meade Maw, MD;  Location: [facility];  Service: Neurosurgery;  Laterality: N/A;  . POLYPECTOMY  01/11/2018   Procedure: POLYPECTOMY;  Surgeon: Lin Landsman, MD;  Location: [facility];  Service: Endoscopy;;  . SPLENECTOMY       Prior to Admission medications   Medication Sig Start Date End Date Taking? Authorizing Provider  carvedilol (COREG) 6.25 MG tablet Take 6.25 mg by mouth 2 (two) times daily with a meal.   Yes [provider]  Insulin Human (INSULIN PUMP) SOLN Inject into the skin. insulin regular human CONCENTRATED (HUMULIN R) 500 UNIT/ML injection   Yes [provider]  levothyroxine (SYNTHROID, LEVOTHROID) 25 MCG tablet Take 1 tablet (25 mcg total) by mouth daily before breakfast. 11/27/15  Yes Fisher, Linden Dolin, PA-C  lipase/protease/amylase (CREON) 36000 UNITS CPEP capsule Take 2 capsules (72,000 Units total) by mouth 3 (  three) times daily with meals. 01/03/19 03/04/19 Yes Vanga, Tally Due, MD  lisinopril (PRINIVIL,ZESTRIL) 2.5 MG tablet Take 2.5 mg by mouth every evening.    Yes [provider]  metFORMIN (GLUCOPHAGE-XR) 500 MG 24 hr tablet Take 1,000 mg by mouth daily before supper.  10/12/17  Yes  [provider]  rifaximin (XIFAXAN) 550 MG TABS tablet Take 1 tablet (550 mg total) by mouth 2 (two) times daily for 14 days. 02/26/19 03/12/19 Yes Vanga, Tally Due, MD  spironolactone (ALDACTONE) 25 MG tablet Take 25 mg by mouth daily.   Yes [provider]  amitriptyline (ELAVIL) 50 MG tablet Take 1 tablet (50 mg total) by mouth at bedtime. 02/26/19   Lin Landsman, MD  furosemide (LASIX) 20 MG tablet Take 20 mg by mouth daily as needed for edema.    [provider]  glucose blood test strip 1 each by Other route every 4 (four) hours. Use as instructed    [provider]  HYDROcodone-acetaminophen (NORCO/VICODIN) 5-325 MG tablet Take 1 tablet by mouth every 6 (six) hours as needed (pain.). Patient not taking: Reported on 03/01/2019 12/13/18   Marin Olp, PA-C  tiZANidine (ZANAFLEX) 4 MG tablet Take 1 tablet (4 mg total) by mouth every 6 (six) hours as needed for muscle spasms. Patient not taking: Reported on 03/01/2019 12/13/18   Marin Olp, PA-C     Allergies Amoxicillin   Family History  Problem Relation Age of Onset  . Heart disease Father   . Diabetes Father     Social History Social History   Tobacco Use  . Smoking status: Never Smoker  . Smokeless tobacco: Never Used  Substance Use Topics  . Alcohol use: Yes    Alcohol/week: 0.0 standard drinks    Comment: wine occasionally  . Drug use: No    Review of Systems  Constitutional:   No fever or chills.  ENT:   No sore throat. No rhinorrhea. Cardiovascular:   No chest pain or syncope. Respiratory:   No dyspnea or cough. Gastrointestinal:   Positive as above for abdominal pain and vomiting. Musculoskeletal:   Negative for focal pain or swelling All other systems reviewed and are negative except as documented above in ROS and HPI.  ____________________________________________   PHYSICAL EXAM:  VITAL SIGNS: ED Triage Vitals  Enc Vitals Group     BP 03/01/19 1210 (!) 142/73      Pulse Rate 03/01/19 1210 (!) 120     Resp 03/01/19 1210 18     Temp 03/01/19 1210 98.1 F (36.7 C)     Temp Source 03/01/19 1210 Oral     SpO2 03/01/19 1210 94 %     Weight 03/01/19 1211 220 lb (99.8 kg)     Height 03/01/19 1211 5\' 8"  (1.727 m)     Head Circumference --      Peak Flow --      Pain Score 03/01/19 1211 5     Pain Loc --      Pain Edu? --      Excl. in [place]? --     Vital signs reviewed, nursing assessments reviewed.   Constitutional:   Alert and oriented.  Ill-appearing. Eyes:   Conjunctivae are normal. EOMI. PERRL. ENT      Head:   Normocephalic and atraumatic.      Nose:   No congestion/rhinnorhea.       Mouth/Throat:   Dry mucous membranes, no pharyngeal erythema. No peritonsillar mass.  Neck:   No meningismus. Full ROM. Hematological/Lymphatic/Immunilogical:   No cervical lymphadenopathy. Cardiovascular:   Tachycardia heart rate 120. Symmetric bilateral radial and DP pulses.  No murmurs. Cap refill less than 2 seconds. Respiratory:   Normal respiratory effort without tachypnea/retractions. Breath sounds are clear and equal bilaterally. No wheezes/rales/rhonchi. Gastrointestinal:   Soft with pronounced epigastric and right upper quadrant tenderness.  Moderately distended. There is no CVA tenderness.  No rebound, rigidity, or guarding.  Musculoskeletal:   Normal range of motion in all extremities. No joint effusions.  No lower extremity tenderness.  No edema. Neurologic:   Normal speech and language.  Motor grossly intact. No acute focal neurologic deficits are appreciated.  Skin:    Skin is warm, dry and intact. No rash noted.  No petechiae, purpura, or bullae.  ____________________________________________    LABS (pertinent positives/negatives) (all labs ordered are listed, but only abnormal results are displayed) Labs Reviewed  COMPREHENSIVE METABOLIC PANEL - Abnormal; Notable for the following components:      Result Value   Sodium 132 (*)     Potassium 5.6 (*)    Chloride 96 (*)    CO2 18 (*)    Glucose, Bld 245 (*)    BUN 39 (*)    Creatinine, Ser 1.75 (*)    Calcium 10.5 (*)    GFR calc non Af Amer 41 (*)    GFR calc Af Amer 48 (*)    Anion gap 18 (*)    All other components within normal limits  CBC - Abnormal; Notable for the following components:   WBC 17.5 (*)    Platelets 613 (*)    All other components within normal limits  URINALYSIS, COMPLETE (UACMP) WITH MICROSCOPIC - Abnormal; Notable for the following components:   Color, Urine YELLOW (*)    APPearance CLEAR (*)    Ketones, ur 5 (*)    All other components within normal limits  MAGNESIUM - Abnormal; Notable for the following components:   Magnesium 1.3 (*)    All other components within normal limits  LIPASE, BLOOD  PHOSPHORUS   ____________________________________________   EKG    ____________________________________________    RADIOLOGY  Ct Abdomen Pelvis W Contrast  Result Date: 03/01/2019 CLINICAL DATA:  Abdominal pain.  History of non-Hodgkin's lymphoma EXAM: CT ABDOMEN AND PELVIS WITH CONTRAST TECHNIQUE: Multidetector CT imaging of the abdomen and pelvis was performed using the standard protocol following bolus administration of intravenous contrast. CONTRAST:  69mL OMNIPAQUE IOHEXOL 300 MG/ML  SOLN COMPARISON:  None. FINDINGS: Lower chest: There is a small right pleural effusion with a minimal left pleural effusion. There is atelectatic change in the right lung base region. Hepatobiliary: The liver contour is somewhat nodular. There is a decreased attenuation mass in the anterior segment of the right lobe of the liver near the dome measuring 1.2 x 1.2 cm. A second similar appearing decreased attenuation mass is seen in right lobe of the liver medially measuring 1.7 x 1.2 cm. These areas likely represent small hemangiomas. Gallbladder is absent. There is no appreciable biliary duct dilatation. Pancreas: There is decreased attenuation throughout  most of the pancreas consistent with diffuse pancreatic necrosis. There is inflammatory type change throughout the pancreas. A small amount of the body of the pancreas shows normal enhancement. There is no appreciable pancreatic duct dilatation. There is extensive peripancreatic fluid surrounding the pancreas. Fluid tracks from the pancreas to surround the stomach. Fluid also extends from the region of the tail the pancreas  to the left abdomen. There is no well-defined pancreatic mass. Spleen: Spleen is absent. There is no mass in the left upper quadrant in the area of previous splenectomy. Surgical clips are noted in this area. Adrenals/Urinary Tract: Adrenals bilaterally appear unremarkable. There are several subcentimeter cysts in the right kidney. There is a mass arising from the upper pole of the left kidney measuring 1.2 x 1.2 cm which has attenuation values higher than is expected with a cyst. A second mass arising from the mid left kidney laterally measuring 7 x 5 mm has attenuation values higher than is expected with a cyst. There is no hydronephrosis on either side. There is a 3 mm calculus in the upper pole of the left kidney. There is no appreciable ureteral calculus on either side. Urinary bladder is midline with wall thickness within normal limits. Stomach/Bowel: There are multiple sigmoid diverticula without diverticulitis. There is no appreciable bowel wall thickening. Areas of mesenteric thickening is likely due to extensive ascites which may be secondary to the extensive pancreatitis. No evident bowel obstruction. There are no for foci of free air or portal venous air. Vascular/Lymphatic: There is aortic atherosclerosis. No aneurysm evident. Major mesenteric arterial vessels appear patent, although there are foci of atherosclerotic calcification. There is a partially calcified mass in the mid abdomen measuring 2.5 x 2.1 cm which is felt to represent localized lymph node enlargement. Given history  of previous lymphoma, this lesion may represent residua of previous treatment for lymphoma. No other areas of apparent lymph node prominence evident. Evidence of previous lymph node dissection in the mid abdomen noted. Reproductive: Prostate contains several calcifications. Prostate and seminal vesicles appear normal in size and contour. Other: There is extensive ascites throughout the abdomen. No abscess is seen in the abdomen or pelvis. Appendix region appears unremarkable. 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 No lytic or destructive bone lesions are evident. There is no intramuscular or abdominal wall lesion. IMPRESSION: 1. 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 No pancreatic mass or duct dilatation evident. 2. Extensive ascites throughout the abdomen and pelvis. Question how much of the apparent ascites is actually due to pancreatitis. 3. There are noncystic masses arising from the left kidney, largest off the upper pole measuring 1.2 x 1.2 cm. Renal cell neoplasm could present in this manner. Further evaluation with pre and post contrast MRI should be considered. Pre and post contrast CT could alternatively be performed, but would likely be of decreased accuracy given lesion size. 4. Liver contour is indicative of hepatic cirrhosis. Probable small hemangiomas in the liver evident. 5. Probable lymphadenopathy, partially calcified, in the mid abdomen, at the level of the celiac artery, immediately adjacent to the third portion of the duodenum. Question treated lymphoma. Particular attention this area on subsequent evaluations is warranted. Postoperative changes are noted consistent with previous lymph node dissection. 6.  Gallbladder absent.  No appreciable biliary duct dilatation. 7. Prostatic calculi present. Nonobstructing 3 mm calculus upper pole left kidney. No hydronephrosis. No ureteral calculi. 8.  Aortic Atherosclerosis (ICD10-I70.0).  Electronically Signed   By: Lowella Grip III M.D.   On: 03/01/2019 13:34    ____________________________________________   PROCEDURES Procedures  ____________________________________________  DIFFERENTIAL DIAGNOSIS   Bowel obstruction, pancreatitis, cholecystitis, diverticulitis, abdominal perforation  CLINICAL IMPRESSION / ASSESSMENT AND PLAN / ED COURSE  Medications ordered in the ED: Medications  metroNIDAZOLE (FLAGYL) IVPB 500 mg (has no administration in time range)  sodium chloride 0.9 % bolus  1,000 mL (has no administration in time range)  promethazine (PHENERGAN) injection 12.5 mg (has no administration in time range)  magnesium sulfate IVPB 2 g 50 mL (has no administration in time range)  sodium chloride flush (NS) 0.9 % injection 3 mL (3 mLs Intravenous Given 03/01/19 1303)  sodium chloride 0.9 % bolus 1,000 mL (0 mLs Intravenous Stopped 03/01/19 1547)  ondansetron (ZOFRAN) injection 4 mg (4 mg Intravenous Given 03/01/19 1252)  morphine 4 MG/ML injection 4 mg (4 mg Intravenous Given 03/01/19 1300)  iohexol (OMNIPAQUE) 300 MG/ML solution 75 mL (75 mLs Intravenous Contrast Given 03/01/19 1312)  HYDROmorphone (DILAUDID) injection 1 mg (1 mg Intravenous Given 03/01/19 1413)  ondansetron (ZOFRAN) injection 4 mg (4 mg Intravenous Given 03/01/19 1413)  cefTRIAXone (ROCEPHIN) 2 g in sodium chloride 0.9 % 100 mL IVPB (2 g Intravenous New Bag/Given 03/01/19 1546)    Pertinent labs & imaging results that were available during my care of the patient were reviewed by me and considered in my medical decision making (see chart for details).  Stephen Harmon was evaluated in Emergency Department on 03/01/2019 for the symptoms described in the history of present illness. He was evaluated in the context of the global COVID-19 pandemic, which necessitated consideration that the patient might be at risk for infection with the SARS-CoV-2 virus that causes COVID-19. Institutional protocols and algorithms  that pertain to the evaluation of patients at risk for COVID-19 are in a state of rapid change based on information released by regulatory bodies including the CDC and federal and state organizations. These policies and algorithms were followed during the patient's care in the ED.   Patient presents with severe upper abdominal pain and tenderness on exam.  He is tachycardic and very uncomfortable.  Labs show a leukocytosis of 73,710, metabolic acidosis with an elevated anion gap.  CT scan reveals necrotizing pancreatitis with associated ascites.  Discussed with surgery Dr. Peyton Najjar who feels that the patient's care may exceed the capabilities of this facility and recommends transfer.  Patient is agreeable and prefers [facility] to have contacted them and spoken with the [facility] transfer center to facilitate transfer to a hospitalist or surgical service as appropriate.  Patient is not septic on initial assessment.   ----------------------------------------- 4:16 PM on 03/01/2019 -----------------------------------------   Vital signs are unremarkable except for persistent tachycardia of about 115 given the finding of necrotizing pancreatitis, ceftriaxone and Flagyl ordered.  Pain is adequately controlled with Dilaudid 1 mg IV.  He has persistent nausea so I am giving him 12.5 mg of Phenergan since he is [place] had 2 doses of Zofran.  Patient received 2 L of saline bolus for hydration.      ____________________________________________   FINAL CLINICAL IMPRESSION(S) / ED DIAGNOSES    Final diagnoses:  Acute necrotizing pancreatitis  Metabolic acidosis  Hypomagnesemia  Stage 3 chronic kidney disease [facility])     ED Discharge Orders    None      Portions of this note were generated with dragon dictation software. Dictation errors may occur despite best attempts at proofreading.   Carrie Mew, MD 03/01/19 1617    Carrie Mew, MD 03/01/19 [phone]

## 2019-03-01 NOTE — ED Notes (Addendum)
Patient gave verbal permission to this RN for all medical updates to be given to wife, Karle Starch.

## 2019-03-01 NOTE — Telephone Encounter (Signed)
Patient's wife has called about patient stating he is in a lot of pain,now with vomiting,not feeling well @ all, and not eating or really drinking. She has sent a my chart message this am. She's just very concerned. Please advise.

## 2019-03-01 NOTE — Telephone Encounter (Signed)
Spoke with pt's wife and states she has taken pt to ER due to pain and vomiting, pt is currently at [facility]

## 2019-03-01 NOTE — ED Provider Notes (Addendum)
-----------------------------------------   10:20 PM on 03/01/2019 -----------------------------------------  I took over care on this patient from Dr. Joni Fears. A short time after shift change, I was contacted by the [facility] transfer center.  I discussed the case with Dr. Tamera Punt who accepted the patient in transfer.  The patient's blood pressure has been stable, and he has had persistent tachycardia.  I added on a lactic acid after discussion with [facility] and it was elevated, so we have given more fluids.  His pain is under control at this time.  The patient is stable for transfer at this time.       Arta Silence, MD 03/01/19 2222

## 2019-03-01 NOTE — ED Notes (Signed)
Patient dozing off after pain medication. Easily arrousable. O2 sat at 87%. Patient placed on 2L [place]. O2 at 95%.

## 2019-03-01 NOTE — ED Notes (Signed)
EMTALA checked for completion  

## 2019-03-01 NOTE — ED Triage Notes (Signed)
Left upper quadrant and burning upper mid for about a week.  Have been in contact with dr Barb Merino via phone.  This am started vomiting large amounts and pain in luq has increased.

## 2019-03-01 NOTE — ED Notes (Signed)
Pt stated that his nausea was increasing and the zofran had not helped much. Dr Joni Fears notified and verbal orders received for 12.5 phenergan IV.

## 2019-03-01 NOTE — ED Notes (Signed)
Attempted IV without success.

## 2019-03-02 ENCOUNTER — Telehealth: Payer: Self-pay | Admitting: Gastroenterology

## 2019-03-02 MED ORDER — HYDROMORPHONE HCL 1 MG/ML IJ SOLN
1.00 | INTRAMUSCULAR | Status: DC
Start: ? — End: 2019-03-02

## 2019-03-02 MED ORDER — ATORVASTATIN CALCIUM 20 MG PO TABS
10.00 | ORAL_TABLET | ORAL | Status: DC
Start: 2019-03-10 — End: 2019-03-02

## 2019-03-02 MED ORDER — LEVOTHYROXINE SODIUM 25 MCG PO TABS
25.00 | ORAL_TABLET | ORAL | Status: DC
Start: 2019-03-12 — End: 2019-03-02

## 2019-03-02 MED ORDER — INSULIN LISPRO 100 UNIT/ML ~~LOC~~ SOLN
0.00 | SUBCUTANEOUS | Status: DC
Start: 2019-03-02 — End: 2019-03-02

## 2019-03-02 MED ORDER — GENERIC EXTERNAL MEDICATION
12.50 | Status: DC
Start: ? — End: 2019-03-02

## 2019-03-02 MED ORDER — AMITRIPTYLINE HCL 25 MG PO TABS
50.00 | ORAL_TABLET | ORAL | Status: DC
Start: 2019-03-22 — End: 2019-03-02

## 2019-03-02 MED ORDER — BENICAR 20 MG PO TABS
12.50 | ORAL_TABLET | ORAL | Status: DC
Start: ? — End: 2019-03-02

## 2019-03-02 MED ORDER — GLUCAGON HCL RDNA (DIAGNOSTIC) 1 MG IJ SOLR
1.00 | INTRAMUSCULAR | Status: DC
Start: ? — End: 2019-03-02

## 2019-03-02 MED ORDER — TUSSI PRES-B 2-15-200 MG/5ML PO LIQD
0.50 | ORAL | Status: DC
Start: ? — End: 2019-03-02

## 2019-03-02 MED ORDER — CARVEDILOL 3.125 MG PO TABS
6.25 | ORAL_TABLET | ORAL | Status: DC
Start: 2019-03-09 — End: 2019-03-02

## 2019-03-02 MED ORDER — SPIRONOLACTONE 25 MG PO TABS
25.00 | ORAL_TABLET | ORAL | Status: DC
Start: 2019-03-02 — End: 2019-03-02

## 2019-03-02 MED ORDER — METRONIDAZOLE IN NACL 5-0.79 MG/ML-% IV SOLN
500.00 | INTRAVENOUS | Status: DC
Start: 2019-03-13 — End: 2019-03-02

## 2019-03-02 MED ORDER — EQUATE NICOTINE 4 MG MT GUM
4.00 | CHEWING_GUM | OROMUCOSAL | Status: DC
Start: ? — End: 2019-03-02

## 2019-03-02 MED ORDER — PANCRELIPASE (LIP-PROT-AMYL) 36000 UNITS PO CPEP
1.00 | ORAL_CAPSULE | ORAL | Status: DC
Start: 2019-03-02 — End: 2019-03-02

## 2019-03-02 MED ORDER — METRISET BURETTE SET MISC
Status: DC
Start: ? — End: 2019-03-02

## 2019-03-02 MED ORDER — ASPIRIN EC 81 MG PO TBEC
81.00 | DELAYED_RELEASE_TABLET | ORAL | Status: DC
Start: 2019-03-10 — End: 2019-03-02

## 2019-03-02 NOTE — Telephone Encounter (Signed)
Called Ms Seiber right away after seeing mychart message from yesterday. When my nurse returned her message yesterday afternoon, Mr Baumgarten was already in [facility] ER. Reviewed his chart, he developed pancreatitis with extensive pancreatic necrosis based on CT scan and transferred to [facility] overnight. When I spoke to Ms Otterness this morning, she said he is doing ok, pain is under control but was feeling nauseous, planning for tube feeds, might be undergoing some type of procedure.  [name], MD [address]  [place]  [place], [place] [address]  Main: [phone]  Fax: [phone] Pager: [phone]

## 2019-03-03 MED ORDER — ENOXAPARIN SODIUM 40 MG/0.4ML ~~LOC~~ SOLN
40.00 | SUBCUTANEOUS | Status: DC
Start: 2019-03-06 — End: 2019-03-03

## 2019-03-03 MED ORDER — HYDROMORPHONE HCL-NACL 25-0.9 MG/25ML-% IV SOSY
.40 | PREFILLED_SYRINGE | INTRAVENOUS | Status: DC
Start: ? — End: 2019-03-03

## 2019-03-03 MED ORDER — GENERIC EXTERNAL MEDICATION
40.00 | Status: DC
Start: 2019-03-22 — End: 2019-03-03

## 2019-03-03 MED ORDER — LACTATED RINGERS IV SOLN
INTRAVENOUS | Status: DC
Start: ? — End: 2019-03-03

## 2019-03-03 MED ORDER — INSULIN LISPRO 100 UNIT/ML ~~LOC~~ SOLN
.00 | SUBCUTANEOUS | Status: DC
Start: 2019-03-03 — End: 2019-03-03

## 2019-03-03 MED ORDER — SODIUM CHLORIDE 0.9 % IV SOLN
INTRAVENOUS | Status: DC
Start: ? — End: 2019-03-03

## 2019-03-03 MED ORDER — INSULIN GLARGINE 100 UNIT/ML ~~LOC~~ SOLN
24.00 | SUBCUTANEOUS | Status: DC
Start: 2019-03-06 — End: 2019-03-03

## 2019-03-03 MED ORDER — GENERIC EXTERNAL MEDICATION
Status: DC
Start: ? — End: 2019-03-03

## 2019-03-03 MED ORDER — NALOXONE HCL 0.4 MG/ML IJ SOLN
.40 | INTRAMUSCULAR | Status: DC
Start: ? — End: 2019-03-03

## 2019-03-05 MED ORDER — COMPOUND W FREEZE OFF EX AERO
0.00 | INHALATION_SPRAY | CUTANEOUS | Status: DC
Start: 2019-03-09 — End: 2019-03-05

## 2019-03-05 MED ORDER — STRI-DEX MAXIMUM STRENGTH 2 % EX PADS
50.00 | MEDICATED_PAD | CUTANEOUS | Status: DC
Start: ? — End: 2019-03-05

## 2019-03-05 MED ORDER — PANCRELIPASE (LIP-PROT-AMYL) 36000 UNITS PO CPEP
1.00 | ORAL_CAPSULE | ORAL | Status: DC
Start: 2019-03-09 — End: 2019-03-05

## 2019-03-05 MED ORDER — SENNOSIDES-DOCUSATE SODIUM 8.6-50 MG PO TABS
2.00 | ORAL_TABLET | ORAL | Status: DC
Start: 2019-03-09 — End: 2019-03-05

## 2019-03-05 MED ORDER — BISACODYL 10 MG/30ML RE ENEM
10.00 | ENEMA | RECTAL | Status: DC
Start: 2019-03-05 — End: 2019-03-05

## 2019-03-05 NOTE — Telephone Encounter (Signed)
Left vm for pt to schedule 2-3 week f/u per Note from Vanga after u/s apt

## 2019-03-06 MED ORDER — HEPARIN SODIUM (PORCINE) 5000 UNIT/ML IJ SOLN
5000.00 | INTRAMUSCULAR | Status: DC
Start: 2019-03-13 — End: 2019-03-06

## 2019-03-06 MED ORDER — INSULIN GLARGINE 100 UNIT/ML ~~LOC~~ SOLN
22.00 | SUBCUTANEOUS | Status: DC
Start: 2019-03-09 — End: 2019-03-06

## 2019-03-06 MED ORDER — DEXTROSE-NACL 5-0.45 % IV SOLN
INTRAVENOUS | Status: DC
Start: ? — End: 2019-03-06

## 2019-03-07 MED ORDER — SPIRONOLACTONE 25 MG PO TABS
12.50 | ORAL_TABLET | ORAL | Status: DC
Start: 2019-03-07 — End: 2019-03-07

## 2019-03-07 MED ORDER — HYDROCHLOROTHIAZIDE 25 MG PO TABS
25.00 | ORAL_TABLET | ORAL | Status: DC
Start: 2019-03-09 — End: 2019-03-07

## 2019-03-07 MED ORDER — GENERIC EXTERNAL MEDICATION
Status: DC
Start: ? — End: 2019-03-07

## 2019-03-07 MED ORDER — ALBUTEROL SULFATE 2.5 MG/0.5ML IN NEBU
2.50 | INHALATION_SOLUTION | RESPIRATORY_TRACT | Status: DC
Start: ? — End: 2019-03-07

## 2019-03-09 MED ORDER — LIDOCAINE HCL 1 % IJ SOLN
3.00 | INTRAMUSCULAR | Status: DC
Start: ? — End: 2019-03-09

## 2019-03-09 MED ORDER — ALBUTEROL SULFATE 2.5 MG/0.5ML IN NEBU
2.50 | INHALATION_SOLUTION | RESPIRATORY_TRACT | Status: DC
Start: 2019-03-09 — End: 2019-03-09

## 2019-03-09 MED ORDER — SPIRONOLACTONE 50 MG PO TABS
25.00 | ORAL_TABLET | ORAL | Status: DC
Start: 2019-03-09 — End: 2019-03-09

## 2019-03-11 MED ORDER — GENERIC EXTERNAL MEDICATION
5.00 | Status: DC
Start: 2019-03-21 — End: 2019-03-11

## 2019-03-11 MED ORDER — SPIRONOLACTONE 25 MG/5ML PO SUSP
25.00 | ORAL | Status: DC
Start: 2019-03-21 — End: 2019-03-11

## 2019-03-11 MED ORDER — GENERIC EXTERNAL MEDICATION
Status: DC
Start: ? — End: 2019-03-11

## 2019-03-11 MED ORDER — DEXTROSE 10 % IV SOLN
INTRAVENOUS | Status: DC
Start: ? — End: 2019-03-11

## 2019-03-11 MED ORDER — INSULIN LISPRO 100 UNIT/ML ~~LOC~~ SOLN
0.00 | SUBCUTANEOUS | Status: DC
Start: 2019-03-11 — End: 2019-03-11

## 2019-03-11 MED ORDER — FUROSEMIDE 10 MG/ML IJ SOLN
20.00 | INTRAMUSCULAR | Status: DC
Start: 2019-03-22 — End: 2019-03-11

## 2019-03-11 MED ORDER — INSULIN GLARGINE 100 UNIT/ML ~~LOC~~ SOLN
36.00 | SUBCUTANEOUS | Status: DC
Start: 2019-03-12 — End: 2019-03-11

## 2019-03-11 MED ORDER — CARBOXYMETHYLCELLULOSE SOD PF 0.5 % OP SOLN
1.00 | OPHTHALMIC | Status: DC
Start: ? — End: 2019-03-11

## 2019-03-11 MED ORDER — ALBUTEROL SULFATE 2.5 MG/0.5ML IN NEBU
2.50 | INHALATION_SOLUTION | RESPIRATORY_TRACT | Status: DC
Start: ? — End: 2019-03-11

## 2019-03-13 MED ORDER — INSULIN GLARGINE 100 UNIT/ML ~~LOC~~ SOLN
50.00 | SUBCUTANEOUS | Status: DC
Start: 2019-03-13 — End: 2019-03-13

## 2019-03-13 MED ORDER — BROMHIST-NR PO
8.00 | ORAL | Status: DC
Start: 2019-03-13 — End: 2019-03-13

## 2019-03-13 MED ORDER — INSULIN REGULAR HUMAN 100 UNIT/ML IJ SOLN
8.00 | INTRAMUSCULAR | Status: DC
Start: 2019-03-14 — End: 2019-03-13

## 2019-03-13 MED ORDER — GENERIC EXTERNAL MEDICATION
Status: DC
Start: ? — End: 2019-03-13

## 2019-03-13 MED ORDER — ACETAMINOPHEN 10 MG/ML IV SOLN
1000.00 | INTRAVENOUS | Status: DC
Start: 2019-03-13 — End: 2019-03-13

## 2019-03-13 MED ORDER — DEXTROSE 50 % IV SOLN
12.50 | INTRAVENOUS | Status: DC
Start: ? — End: 2019-03-13

## 2019-03-13 MED ORDER — KLS ESOMEPRAZOLE MAGNESIUM 20 MG PO CPDR
12.50 | DELAYED_RELEASE_CAPSULE | ORAL | Status: DC
Start: 2019-03-22 — End: 2019-03-13

## 2019-03-13 MED ORDER — INSULIN REGULAR HUMAN 100 UNIT/ML IJ SOLN
0.00 | INTRAMUSCULAR | Status: DC
Start: 2019-03-13 — End: 2019-03-13

## 2019-03-13 MED ORDER — BISACODYL 10 MG RE SUPP
10.00 | RECTAL | Status: DC
Start: ? — End: 2019-03-13

## 2019-03-14 ENCOUNTER — Telehealth: Payer: Self-pay | Admitting: Gastroenterology

## 2019-03-14 NOTE — Telephone Encounter (Signed)
Heather from [facility] left vm regarding pt CT on 03/19/19 pt secondary Insurance Christella Scheuermann requires pre authorization please call her at cb [phone] ext [phone]

## 2019-03-14 NOTE — Telephone Encounter (Signed)
No pre authorization needed

## 2019-03-14 NOTE — Telephone Encounter (Signed)
CT scan has been cancelled. 

## 2019-03-19 ENCOUNTER — Ambulatory Visit: Payer: Medicare Other

## 2019-03-21 MED ORDER — INSULIN REGULAR HUMAN 100 UNIT/ML IJ SOLN
0.00 | INTRAMUSCULAR | Status: DC
Start: 2019-03-21 — End: 2019-03-21

## 2019-03-21 MED ORDER — CALCIUM CARBONATE ANTACID 750 MG PO CHEW
CHEWABLE_TABLET | ORAL | Status: DC
Start: ? — End: 2019-03-21

## 2019-03-21 MED ORDER — INSULIN REGULAR HUMAN 100 UNIT/ML IJ SOLN
10.00 | INTRAMUSCULAR | Status: DC
Start: 2019-03-22 — End: 2019-03-21

## 2019-03-21 MED ORDER — SODIUM CHLORIDE 0.9 % IV SOLN
INTRAVENOUS | Status: DC
Start: ? — End: 2019-03-21

## 2019-03-21 MED ORDER — ASPIRIN 81 MG PO CHEW
81.00 | CHEWABLE_TABLET | ORAL | Status: DC
Start: 2019-03-22 — End: 2019-03-21

## 2019-03-21 MED ORDER — SENNA 8.8 MG/5ML PO SYRP
5.00 | ORAL_SOLUTION | ORAL | Status: DC
Start: 2019-03-21 — End: 2019-03-21

## 2019-03-21 MED ORDER — GENERIC EXTERNAL MEDICATION
Status: DC
Start: ? — End: 2019-03-21

## 2019-03-21 MED ORDER — HEPARIN SODIUM (PORCINE) 5000 UNIT/ML IJ SOLN
5000.00 | INTRAMUSCULAR | Status: DC
Start: 2019-03-21 — End: 2019-03-21

## 2019-03-21 MED ORDER — HYDROMORPHONE HCL-NACL 25-0.9 MG/25ML-% IV SOSY
.40 | PREFILLED_SYRINGE | INTRAVENOUS | Status: DC
Start: ? — End: 2019-03-21

## 2019-03-21 MED ORDER — INSULIN GLARGINE 100 UNIT/ML ~~LOC~~ SOLN
40.00 | SUBCUTANEOUS | Status: DC
Start: 2019-03-22 — End: 2019-03-21

## 2019-03-21 MED ORDER — BISACODYL 10 MG RE SUPP
10.00 | RECTAL | Status: DC
Start: 2019-03-22 — End: 2019-03-21

## 2019-03-21 MED ORDER — INSULIN REGULAR HUMAN 100 UNIT/ML IJ SOLN
10.00 | INTRAMUSCULAR | Status: DC
Start: 2019-03-21 — End: 2019-03-21

## 2019-03-21 MED ORDER — DEXTROSE 50 % IV SOLN
12.50 | INTRAVENOUS | Status: DC
Start: ? — End: 2019-03-21

## 2019-03-21 MED ORDER — SELECT BRAND INSULIN SYRINGE 29G X 1/2" 1 ML MISC
1.00 | Status: DC
Start: 2019-03-22 — End: 2019-03-21

## 2019-03-21 MED ORDER — NALOXONE HCL 0.4 MG/ML IJ SOLN
0.40 | INTRAMUSCULAR | Status: DC
Start: ? — End: 2019-03-21

## 2019-03-21 MED ORDER — DEWITTS PAIN RELIEVER 325 MG PO TABS
5.00 | ORAL_TABLET | ORAL | Status: DC
Start: ? — End: 2019-03-21

## 2019-04-23 DEATH — deceased

## 2020-07-11 IMAGING — CT CT ABDOMEN AND PELVIS WITH CONTRAST
2 of 5 series · 13 of 46 positions shown, 15 images · IV contrast (APPLIED)
Comparison: None.

CLINICAL DATA: Abdominal pain.  History of non-Hodgkin's lymphoma

EXAM:
CT ABDOMEN AND PELVIS WITH CONTRAST
TECHNIQUE: Multidetector CT imaging of the abdomen and pelvis was performed
using the standard protocol following bolus administration of
intravenous contrast.
CONTRAST:  75mL OMNIPAQUE IOHEXOL 300 MG/ML  SOLN

[Series 2: axial st · axial · 0.82mm/px · z∈[-538,-78]mm · 10 of 104 slices shown, 12 images]
[im 6/104  soft-tissue]
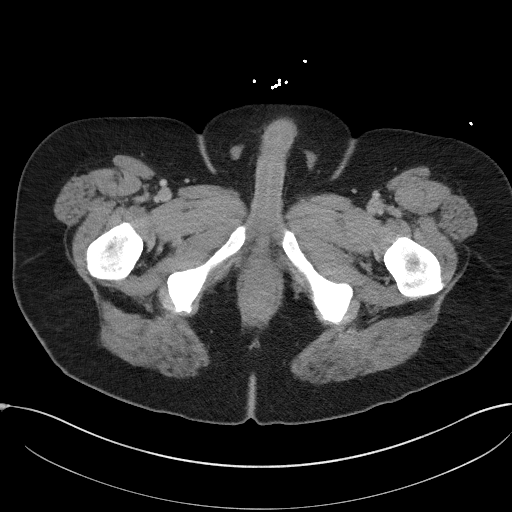
[im 6/104  bone]
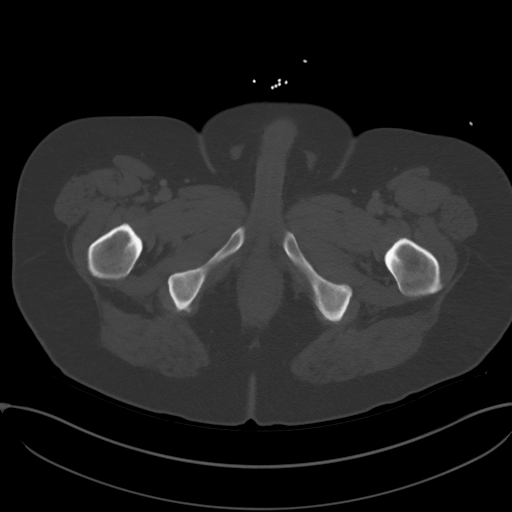
[im 17/104  soft-tissue]
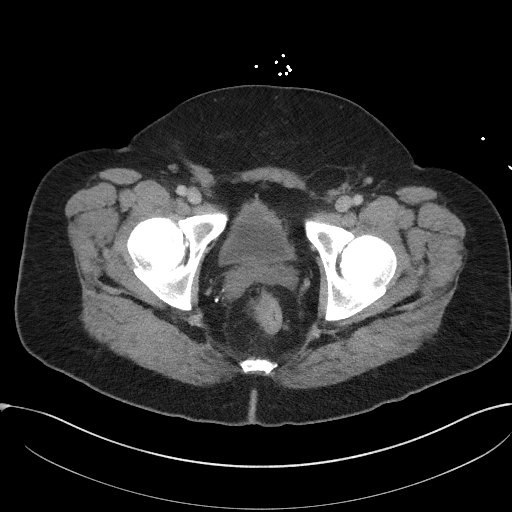
[im 28/104  soft-tissue]
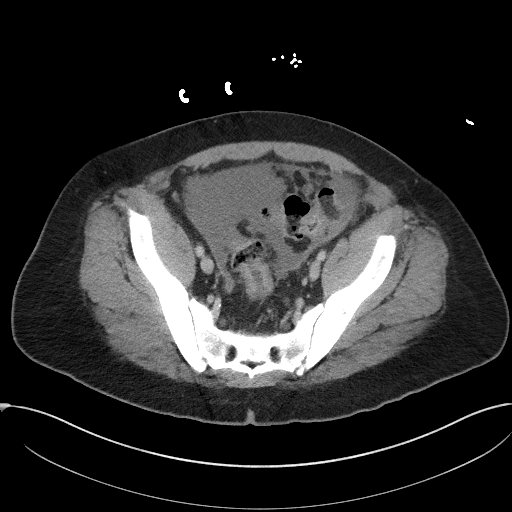
[im 38/104  soft-tissue]
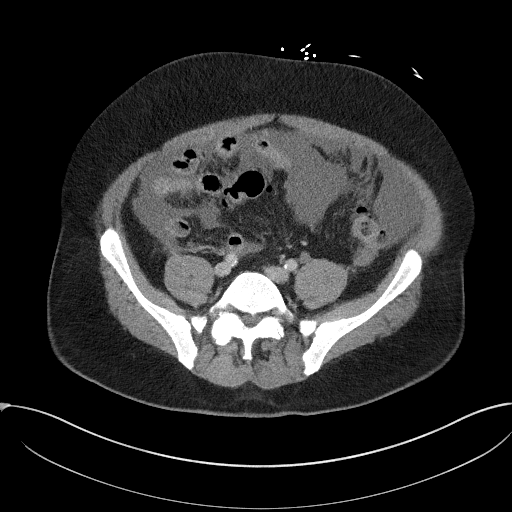
[im 49/104  soft-tissue]
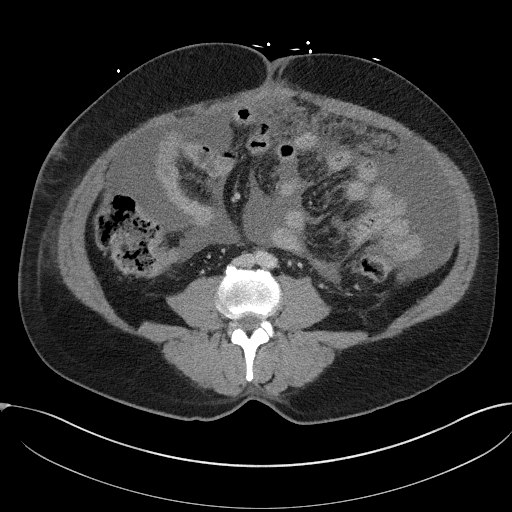
[im 55/104  soft-tissue]
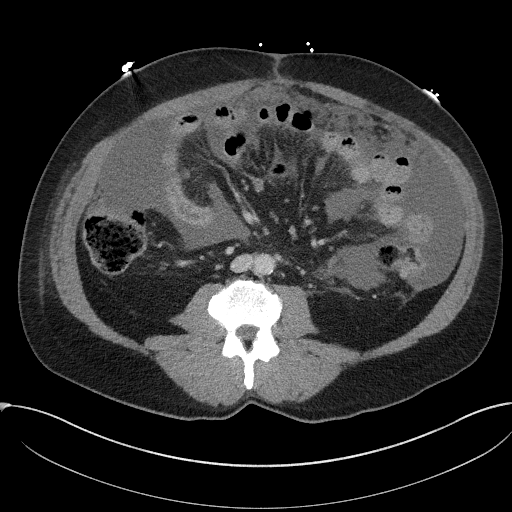
[im 66/104  soft-tissue]
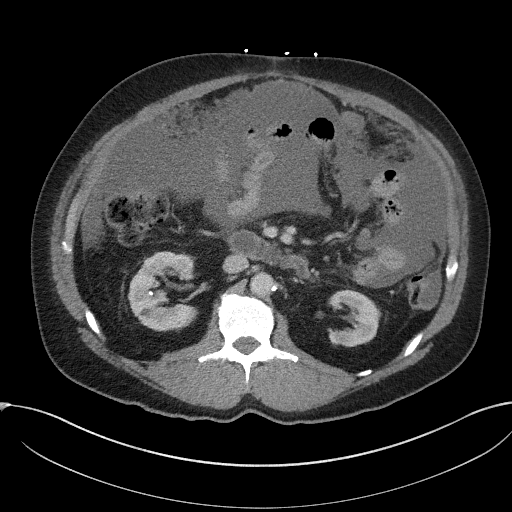
[im 76/104  soft-tissue]
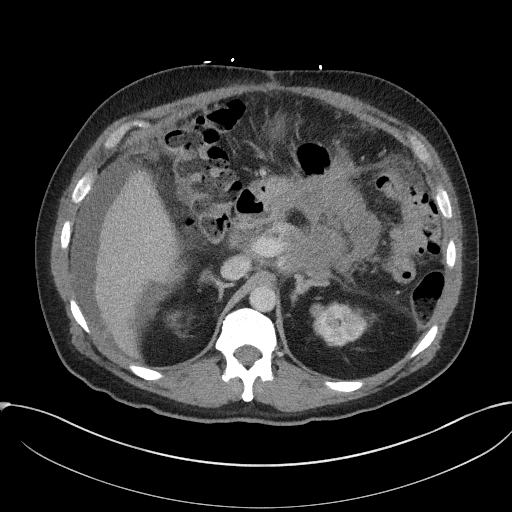
[im 87/104  soft-tissue]
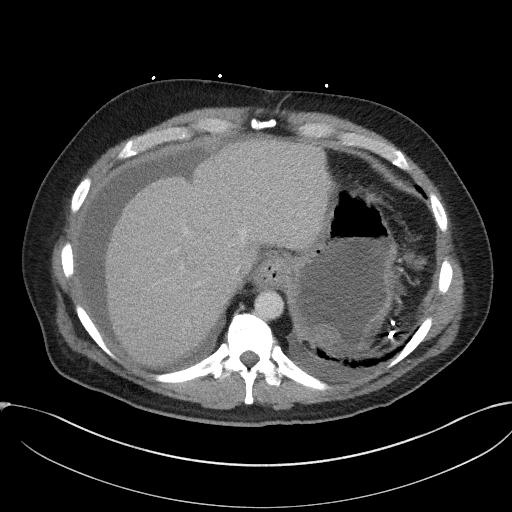
[im 87/104  bone]
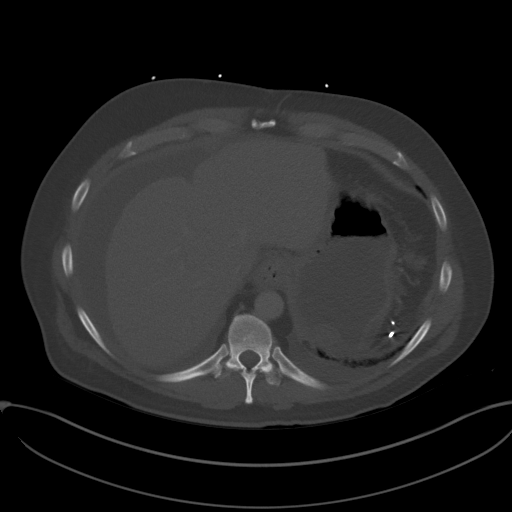
[im 98/104  soft-tissue]
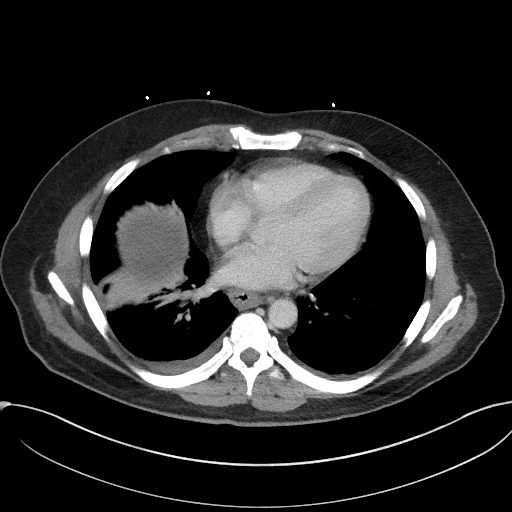

[Series 5: coronal st · coronal · 0.75mm/px · 3 of 104 slices shown]
[im 35/104  soft-tissue]
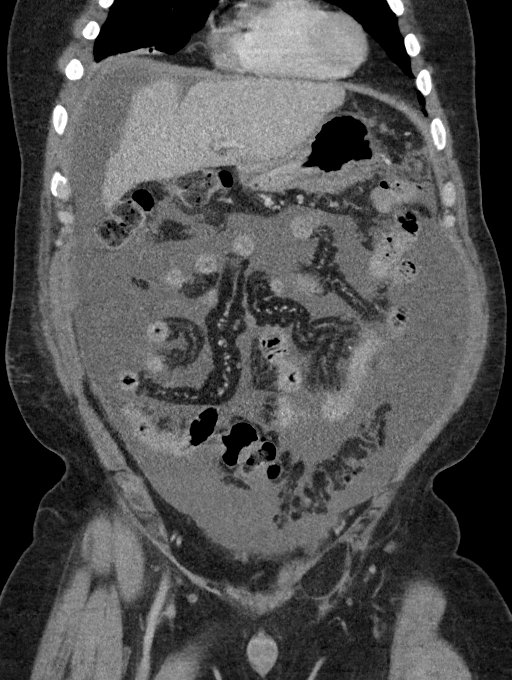
[im 46/104  soft-tissue]
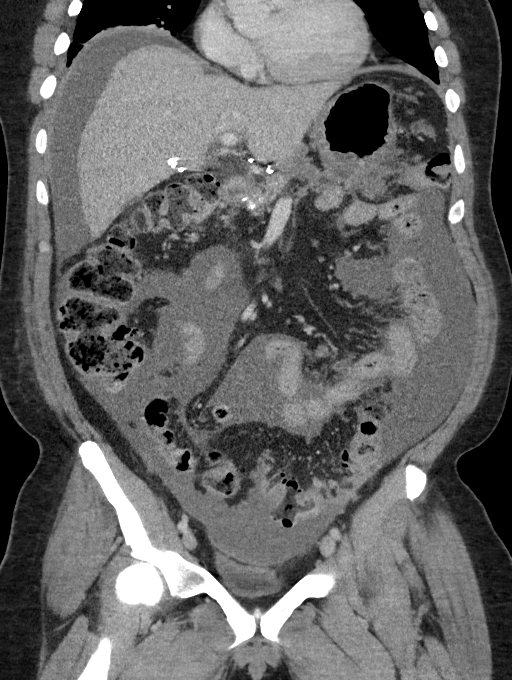
[im 58/104  soft-tissue]
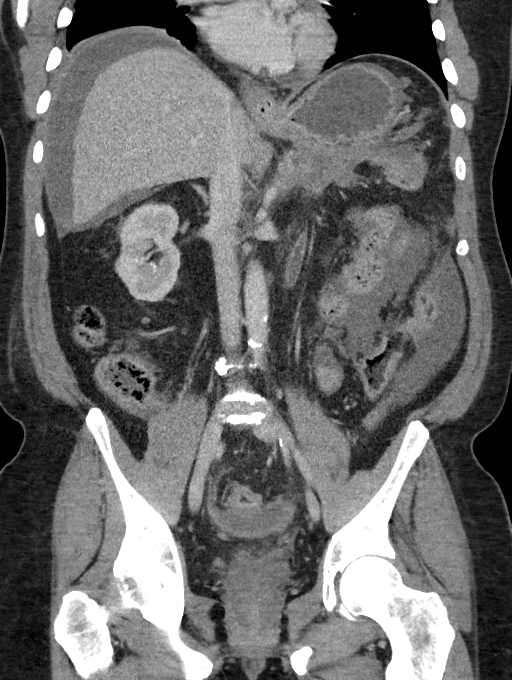

[13 of 46 positions shown; findings below may reference images not displayed]

FINDINGS: Lower chest: There is a small right pleural effusion with a minimal
left pleural effusion. There is atelectatic change in the right lung
base region.

Hepatobiliary: The liver contour is somewhat nodular. There is a
decreased attenuation mass in the anterior segment of the right lobe
of the liver near the dome measuring 1.2 x 1.2 cm. A second similar
appearing decreased attenuation mass is seen in right lobe of the
liver medially measuring 1.7 x 1.2 cm. These areas likely represent
small hemangiomas. Gallbladder is absent. There is no appreciable
biliary duct dilatation.

Pancreas: There is decreased attenuation throughout most of the
pancreas consistent with diffuse pancreatic necrosis. There is
inflammatory type change throughout the pancreas. A small amount of
the body of the pancreas shows normal enhancement. There is no
appreciable pancreatic duct dilatation. There is extensive
peripancreatic fluid surrounding the pancreas. Fluid tracks from the
pancreas to surround the stomach. Fluid also extends from the region
of the tail the pancreas to the left abdomen. There is no
well-defined pancreatic mass.

Spleen: Spleen is absent. There is no mass in the left upper
quadrant in the area of previous splenectomy. Surgical clips are
noted in this area.

Adrenals/Urinary Tract: Adrenals bilaterally appear unremarkable.
There are several subcentimeter cysts in the right kidney. There is
a mass arising from the upper pole of the left kidney measuring
x 1.2 cm which has attenuation values higher than is expected with a
cyst. A second mass arising from the mid left kidney laterally
measuring 7 x 5 mm has attenuation values higher than is expected
with a cyst. There is no hydronephrosis on either side. There is a 3
mm calculus in the upper pole of the left kidney. There is no
appreciable ureteral calculus on either side. Urinary bladder is
midline with wall thickness within normal limits.

Stomach/Bowel: There are multiple sigmoid diverticula without
diverticulitis. There is no appreciable bowel wall thickening. Areas
of mesenteric thickening is likely due to extensive ascites which
may be secondary to the extensive pancreatitis. No evident bowel
obstruction. There are no for foci of free air or portal venous air.

Vascular/Lymphatic: There is aortic atherosclerosis. No aneurysm
evident. Major mesenteric arterial vessels appear patent, although
there are foci of atherosclerotic calcification. There is a
partially calcified mass in the mid abdomen measuring 2.5 x 2.1 cm
which is felt to represent localized lymph node enlargement. Given
history of previous lymphoma, this lesion may represent residua of
previous treatment for lymphoma. No other areas of apparent lymph
node prominence evident. Evidence of previous lymph node dissection
in the mid abdomen noted.

Reproductive: Prostate contains several calcifications. Prostate and
seminal vesicles appear normal in size and contour.

Other: There is extensive ascites throughout the abdomen. No abscess
is seen in the abdomen or pelvis. Appendix region appears
unremarkable.

Musculoskeletal: There is evidence of avascular necrosis in the
right femoral head. No lytic or destructive bone lesions are
evident. There is no intramuscular or abdominal wall lesion.
IMPRESSION: 1. Pancreatitis with evidence of extensive pancreatic necrosis. No
pancreatic mass or duct dilatation evident.

2. Extensive ascites throughout the abdomen and pelvis. Question how
much of the apparent ascites is actually due to pancreatitis.

3. There are noncystic masses arising from the left kidney, largest
off the upper pole measuring 1.2 x 1.2 cm. Renal cell neoplasm could
present in this manner. Further evaluation with pre and post
contrast MRI should be considered. Pre and post contrast CT could
alternatively be performed, but would likely be of decreased
accuracy given lesion size.

4. Liver contour is indicative of hepatic cirrhosis. Probable small
hemangiomas in the liver evident.

5. Probable lymphadenopathy, partially calcified, in the mid
abdomen, at the level of the celiac artery, immediately adjacent to
the third portion of the duodenum. Question treated lymphoma.
Particular attention this area on subsequent evaluations is
warranted. Postoperative changes are noted consistent with previous
lymph node dissection.

6.  Gallbladder absent.  No appreciable biliary duct dilatation.

7. Prostatic calculi present. Nonobstructing 3 mm calculus upper
pole left kidney. No hydronephrosis. No ureteral calculi.

8.  Aortic Atherosclerosis ([id number]).
